# Patient Record
Sex: Female | Born: 1960
Health system: Southern US, Community
[De-identification: ages and names within clinical notes are randomized; demographics above are authoritative.]

## PROBLEM LIST (undated history)

## (undated) DIAGNOSIS — I639 Cerebral infarction, unspecified: Secondary | ICD-10-CM

## (undated) DIAGNOSIS — E871 Hypo-osmolality and hyponatremia: Secondary | ICD-10-CM

## (undated) DIAGNOSIS — E7800 Pure hypercholesterolemia, unspecified: Secondary | ICD-10-CM

## (undated) DIAGNOSIS — I10 Essential (primary) hypertension: Secondary | ICD-10-CM

## (undated) DIAGNOSIS — R569 Unspecified convulsions: Secondary | ICD-10-CM

## (undated) DIAGNOSIS — N189 Chronic kidney disease, unspecified: Secondary | ICD-10-CM

## (undated) HISTORY — PX: UTERINE FIBROID SURGERY: SHX826

## (undated) HISTORY — DX: Hypo-osmolality and hyponatremia: E87.1

## (undated) HISTORY — DX: Pure hypercholesterolemia, unspecified: E78.00

## (undated) HISTORY — DX: Chronic kidney disease, unspecified: N18.9

## (undated) HISTORY — DX: Cerebral infarction, unspecified: I63.9

## (undated) HISTORY — PX: ABDOMINAL HYSTERECTOMY: SHX81

## (undated) HISTORY — DX: Essential (primary) hypertension: I10

---

## 1996-10-21 DIAGNOSIS — I639 Cerebral infarction, unspecified: Secondary | ICD-10-CM

## 1996-10-21 HISTORY — DX: Cerebral infarction, unspecified: I63.9

## 2001-03-31 ENCOUNTER — Encounter: Payer: Self-pay | Admitting: Internal Medicine

## 2001-03-31 ENCOUNTER — Ambulatory Visit (HOSPITAL_COMMUNITY): Admission: RE | Admit: 2001-03-31 | Discharge: 2001-03-31 | Payer: Self-pay | Admitting: Internal Medicine

## 2002-02-18 ENCOUNTER — Ambulatory Visit (HOSPITAL_COMMUNITY): Admission: RE | Admit: 2002-02-18 | Discharge: 2002-02-18 | Payer: Self-pay | Admitting: Internal Medicine

## 2002-02-19 ENCOUNTER — Encounter: Payer: Self-pay | Admitting: Internal Medicine

## 2002-03-12 ENCOUNTER — Ambulatory Visit (HOSPITAL_COMMUNITY): Admission: RE | Admit: 2002-03-12 | Discharge: 2002-03-12 | Payer: Self-pay | Admitting: Cardiology

## 2002-03-19 ENCOUNTER — Ambulatory Visit (HOSPITAL_COMMUNITY): Admission: RE | Admit: 2002-03-19 | Discharge: 2002-03-19 | Payer: Self-pay | Admitting: Cardiology

## 2002-03-29 ENCOUNTER — Ambulatory Visit (HOSPITAL_COMMUNITY): Admission: RE | Admit: 2002-03-29 | Discharge: 2002-03-29 | Payer: Self-pay | Admitting: Cardiology

## 2002-03-31 ENCOUNTER — Ambulatory Visit (HOSPITAL_COMMUNITY): Admission: RE | Admit: 2002-03-31 | Discharge: 2002-03-31 | Payer: Self-pay | Admitting: Internal Medicine

## 2002-03-31 ENCOUNTER — Encounter: Payer: Self-pay | Admitting: Internal Medicine

## 2003-04-01 ENCOUNTER — Encounter: Payer: Self-pay | Admitting: Internal Medicine

## 2003-04-01 ENCOUNTER — Ambulatory Visit (HOSPITAL_COMMUNITY): Admission: RE | Admit: 2003-04-01 | Discharge: 2003-04-01 | Payer: Self-pay | Admitting: Internal Medicine

## 2004-01-30 ENCOUNTER — Ambulatory Visit (HOSPITAL_COMMUNITY): Admission: RE | Admit: 2004-01-30 | Discharge: 2004-01-30 | Payer: Self-pay | Admitting: Neurology

## 2004-10-01 ENCOUNTER — Ambulatory Visit (HOSPITAL_COMMUNITY): Admission: RE | Admit: 2004-10-01 | Discharge: 2004-10-01 | Payer: Self-pay | Admitting: Internal Medicine

## 2005-03-27 ENCOUNTER — Ambulatory Visit (HOSPITAL_COMMUNITY): Admission: RE | Admit: 2005-03-27 | Discharge: 2005-03-27 | Payer: Self-pay | Admitting: Internal Medicine

## 2005-11-13 ENCOUNTER — Ambulatory Visit (HOSPITAL_COMMUNITY): Admission: RE | Admit: 2005-11-13 | Discharge: 2005-11-13 | Payer: Self-pay | Admitting: Internal Medicine

## 2010-03-05 ENCOUNTER — Ambulatory Visit (HOSPITAL_COMMUNITY): Admission: RE | Admit: 2010-03-05 | Discharge: 2010-03-05 | Payer: Self-pay | Admitting: Internal Medicine

## 2011-03-08 NOTE — Procedures (Signed)
[facility]  Patient:    [name], [name] Visit Number: [id number] MRN: [id number]          Service Type: OUT Location: RAD Attending Physician:  Carylon Perches Dictated by:   Carylon Perches, M.D. Proc. Date: 02/18/02 Admit Date:  02/18/2002                                Stress Test  PROCEDURE:  Cardiolite stress test  CARDIOLOGIST:  Carylon Perches, M.D.  DESCRIPTION OF PROCEDURE:  The patient exercised 4 minutes and 30 seconds (1 minute and 30 seconds in stage 2 of the Bruce protocol), attaining a maximal heart rate of 135 (75% of the age-predicted maximal heart rate), at a work load of 5.7 METS, and discontinued exercise due to difficulty walking.  The treadmill was actually slowed during stage 2.  She was unable to keep up with it due to left leg weakness and stiffness from a prior stroke.  There were no symptoms of chest pain.  There were no arrhythmias.  There were no ST segment changes diagnostic of ischemia.  The baseline electrocardiogram revealed normal sinus rhythm at 67 beats per minute, with nonspecific inferolateral ST-T wave changes.  IMPRESSION:  No evidence of exercise-induced ischemia, at a suboptimal exercise level.  Cardiolite images pending. Dictated by:   Carylon Perches, M.D. Attending Physician:  Carylon Perches DD:  02/18/02 TD:  02/20/02 Job: [id number]

## 2011-03-08 NOTE — Cardiovascular Report (Signed)
[place]  Patient:    JHANVI, DRAKEFORD Visit Number: [id number] MRN: [id number]          Service Type: CAT Location: [facility] 2859 [id number] Attending Physician:  Ronaldo Miyamoto Dictated by:   Arturo Morton Riley Kill, M.D. [facility] Proc. Date: 03/19/02 Admit Date:  03/19/2002   CC:         CV Laboratory  Madolyn Frieze. Jens Som, M.D. [facility]  Carylon Perches, M.D.   Cardiac Catheterization  INDICATIONS:  Ms. Duross is a 50 year old who has had some intermittent chest pain.  She also has a history of hypertension and hyperlipidemia.  She has had a prior hysterectomy.  She currently is on multiple drug therapy.  The current study was done to assess coronary anatomy.  Fortunately, she has quit smoking in the past.  A Cardiolite study revealed decreased activity in the mid anterior wall.  She has some known renal insufficiency and was treated with Mucomyst.  A chest x-ray was done Mar 14, 2002, and revealed that the heart size and mediastinal contours were normal.  The lungs were clear and the visualized skeleton was unremarkable.  PROCEDURES: 1. Left and right catheterization. 2. Selective coronary arteriography.  CARDIOLOGIST:  Arturo Morton. Riley Kill, M.D.  DESCRIPTION OF PROCEDURE:  The procedure was performed from the right femoral artery using 6 French catheters.  She tolerated the procedure well.  There were no complications.  Importantly, we did not do ventriculogram to avoid contrast load.  HEMODYNAMIC DATA:  The central aortic pressure was 165/77, LV pressure 148/10. There was no gradient on pullback across the aortic valve.  ANGIOGRAPHIC DATA: 1. The left main coronary artery was free of critical disease.  It did taper    slightly at the ostium, but no high grade areas of narrowing were noted.  2. The left anterior descending artery coursed to the apex.  In the mid distal portion of the vessel there was perhaps minimal plaquing that did not measure more  than 20% luminal reduction.  The septal and the diagonal appeared to be free of significant disease.  3. The circumflex demonstrates minimal irregularity in the mid vessel.  There is perhaps 20% narrowing.  4. The AV circumflex is without significant disease.  5. The right coronary artery is a dominant vessel with a PDA and posterior lateral branch.  There is 20-30% narrowing that is eccentric and not high grade noted near the acute margin.  No ventriculography was done.  CONCLUSIONS:  Scattered minor irregularities without high grade focal stenosis.  DISPOSITION:  A D-diamer was obtained.  A chest x-ray has been normal.  Other sources of chest discomfort will be sought. Dictated by:   Arturo Morton Riley Kill, M.D. [facility] Attending Physician:  Ronaldo Miyamoto DD:  03/19/02 TD:  03/20/02 Job: [id number]

## 2011-04-12 ENCOUNTER — Other Ambulatory Visit (HOSPITAL_COMMUNITY): Payer: Self-pay | Admitting: Internal Medicine

## 2011-04-12 DIAGNOSIS — Z139 Encounter for screening, unspecified: Secondary | ICD-10-CM

## 2011-04-15 ENCOUNTER — Ambulatory Visit (HOSPITAL_COMMUNITY)
Admission: RE | Admit: 2011-04-15 | Discharge: 2011-04-15 | Disposition: A | Discharge status: Home / Self Care | Payer: Medicare Other | Source: Ambulatory Visit | Attending: Internal Medicine | Admitting: Internal Medicine

## 2011-04-15 DIAGNOSIS — Z1231 Encounter for screening mammogram for malignant neoplasm of breast: Secondary | ICD-10-CM | POA: Insufficient documentation

## 2011-04-15 DIAGNOSIS — Z139 Encounter for screening, unspecified: Secondary | ICD-10-CM

## 2011-08-07 LAB — COMPREHENSIVE METABOLIC PANEL
Alkaline Phosphatase: 56 U/L
BUN: 20 mg/dL (ref 4–21)
Creat: 1.27
Glucose: 94 mg/dL
Total Bilirubin: 0.4 mg/dL

## 2011-08-22 ENCOUNTER — Telehealth: Payer: Self-pay

## 2011-08-22 NOTE — Telephone Encounter (Signed)
Tried to call pt, busy.

## 2011-08-27 NOTE — Telephone Encounter (Signed)
LM for pt to call

## 2011-08-29 NOTE — Telephone Encounter (Signed)
Letter mailed for pt to call.  

## 2011-09-25 ENCOUNTER — Encounter: Payer: Self-pay | Admitting: Urgent Care

## 2011-09-25 ENCOUNTER — Ambulatory Visit (INDEPENDENT_AMBULATORY_CARE_PROVIDER_SITE_OTHER): Payer: Medicare Other | Admitting: Urgent Care

## 2011-09-25 DIAGNOSIS — Z1211 Encounter for screening for malignant neoplasm of colon: Secondary | ICD-10-CM

## 2011-09-25 DIAGNOSIS — N189 Chronic kidney disease, unspecified: Secondary | ICD-10-CM | POA: Insufficient documentation

## 2011-09-25 DIAGNOSIS — E871 Hypo-osmolality and hyponatremia: Secondary | ICD-10-CM

## 2011-09-25 NOTE — Assessment & Plan Note (Signed)
[name] is a 50 y.o. female who is due for screening colonoscopy. She denies any alarm features.  I have discussed risks & benefits which include, but are not limited to, bleeding, infection, perforation & drug reaction.  The patient agrees with this plan & written consent will be obtained.

## 2011-09-25 NOTE — Assessment & Plan Note (Signed)
She does have chronic kidney disease and recent hyponatremia. Last sodium was 126.  Due to this we will recheck met 7 prior to her prep for her colonoscopy. She will also be given tri-lytely with tap water enemas as instead of standard Fleets.

## 2011-09-25 NOTE — Assessment & Plan Note (Signed)
See CKD

## 2011-09-25 NOTE — Progress Notes (Signed)
Referring Provider: Carylon Perches, MD Primary Care Physician:  Carylon Perches, MD Primary Gastroenterologist:  Dr. Darrick Penna  Chief Complaint  Patient presents with  . Colonoscopy   HPI:  [name] is a 50 y.o. female here as a referral from Dr. Ouida Sills for screening colonoscopy.  She has hx chronic kidney disease & recent hyponatremia, therefore she was scheduled for an office visit prior to her procedure.   Last NA 126 & creatinine 1.27 on 08/07/11.  She denies any upper GI symptoms including heartburn, indigestion, nausea, vomiting, dysphagia, odynophagia or anorexia.  Denies any lower GI symptoms including constipation, diarrhea, rectal bleeding, melena or weight loss. She has never had a colonoscopy before.  Past Medical History  Diagnosis Date  . CVA (cerebral infarction) 1998  . HTN (hypertension)   . Hypercholesteremia   . Hyponatremia     Na 126  . Chronic kidney disease     Past Surgical History  Procedure Date  . Abdominal hysterectomy     complete  . Uterine fibroid surgery     x 2    Current Outpatient Prescriptions  Medication Sig Dispense Refill  . acetaminophen (TYLENOL) 650 MG CR tablet Take 650 mg by mouth every 8 (eight) hours as needed.        Marland Kitchen amLODipine (NORVASC) 10 MG tablet       . aspirin 325 MG EC tablet Take 325 mg by mouth daily.        . carvedilol (COREG) 12.5 MG tablet Take 12.5 mg by mouth 2 (two) times daily with a meal.        . cloNIDine (CATAPRES) 0.3 MG tablet       . folic acid (FOLVITE) 400 MCG tablet Take 400 mcg by mouth daily.        Marland Kitchen KLOR-CON M20 20 MEQ tablet Take 20 mEq by mouth daily.       Marland Kitchen olmesartan-hydrochlorothiazide (BENICAR HCT) 40-25 MG per tablet Take 1 tablet by mouth daily.        Marland Kitchen pyridOXINE (VITAMIN B-6) 100 MG tablet Take 100 mg by mouth daily.        . simvastatin (ZOCOR) 10 MG tablet Take 10 mg by mouth at bedtime.       . traMADol (ULTRAM) 50 MG tablet Take 50 mg by mouth every 8 (eight) hours as needed.       .  vitamin B-12 (CYANOCOBALAMIN) 100 MCG tablet Take 50 mcg by mouth daily.          Allergies as of 09/25/2011  . (No Known Allergies)    Family History:There is no known family history of colorectal carcinoma , liver disease, or inflammatory bowel disease.  Problem Relation Age of Onset  . Coronary artery disease Father   . Coronary artery disease Brother   . Coronary artery disease Brother   . Stroke Mother     History   Social History  . Marital Status: Married    Spouse Name: N/A    Number of Children: 0  . Years of Education: N/A   Occupational History  . disabled    Social History Main Topics  . Smoking status: Former Smoker -- 1.0 packs/day for 12 years    Types: Cigarettes    Quit date: 10/22/1991  . Smokeless tobacco: Not on file  . Alcohol Use: No  . Drug Use: No  . Sexually Active: Not on file  Review of Systems: Gen: Denies any fever, chills, sweats, anorexia, fatigue, weakness,  malaise, weight loss, and sleep disorder CV: Denies chest pain, angina, palpitations, syncope, orthopnea, PND, peripheral edema, and claudication. Resp: Denies dyspnea at rest, dyspnea with exercise, cough, sputum, wheezing, coughing up blood, and pleurisy. GI: Denies vomiting blood, jaundice, and fecal incontinence.   Denies dysphagia or odynophagia. GU : Denies urinary burning, blood in urine, urinary frequency, urinary hesitancy, nocturnal urination, and urinary incontinence. MS: Denies joint pain, limitation of movement, and swelling, stiffness, low back pain, extremity pain. Denies muscle weakness, cramps, atrophy.  Derm: Denies rash, itching, dry skin, hives, moles, warts, or unhealing ulcers.  Psych: Denies depression, anxiety, memory loss, suicidal ideation, hallucinations, paranoia, and confusion. Heme: Denies bruising, bleeding, and enlarged lymph nodes.  Physical Exam: BP 142/82  Pulse 53  Temp(Src) 98.2 F (36.8 C) (Temporal)  Ht 5\' 2"  (1.575 m)  Wt 158 lb 9.6 oz (71.94  kg)  BMI 29.01 kg/m2 General:   Alert,  Well-developed, well-nourished, pleasant and cooperative in NAD Head:  Normocephalic and atraumatic. Eyes:  Sclera clear, no icterus.   Conjunctiva pink. Ears:  Normal auditory acuity. Nose:  No deformity, discharge,  or lesions. Mouth:  No deformity or lesions, oropharynx pink & moist. Neck:  Supple; no masses or thyromegaly. Lungs:  Clear throughout to auscultation.   No wheezes, crackles, or rhonchi. No acute distress. Heart:  Regular rate and rhythm; 2/6 murmur.  No clicks, rubs,  or gallops. Abdomen:  Soft, nontender and nondistended. No masses, hepatosplenomegaly or hernias noted. Normal bowel sounds, without guarding, and without rebound.   Rectal:  Deferred until time of colonoscopy.   Msk:  Symmetrical without gross deformities. Normal posture. Pulses:  Normal pulses noted. Extremities:  Without clubbing or edema. Neurologic:  Alert and  oriented x4;  grossly normal neurologically. Skin:  Intact without significant lesions or rashes. Cervical Nodes:  No significant cervical adenopathy. Psych:  Alert and cooperative. Normal mood and affect.

## 2011-09-25 NOTE — Patient Instructions (Signed)
Go get your labs at least 1 week before your procedure You need a colonoscopy as planned

## 2011-09-25 NOTE — Progress Notes (Signed)
Cc to PCP 

## 2011-09-27 NOTE — Progress Notes (Signed)
PT NEED TO KNOW THE PREP CAN WORSEN HER HYPONATREMIA.  REVIEWED.

## 2011-10-08 ENCOUNTER — Encounter (HOSPITAL_COMMUNITY): Payer: Self-pay | Admitting: Pharmacy Technician

## 2011-10-09 ENCOUNTER — Telehealth: Payer: Self-pay | Admitting: Urgent Care

## 2011-10-09 NOTE — Telephone Encounter (Signed)
Pt reminded to get BMET Friday prior to TCS

## 2011-10-12 LAB — BASIC METABOLIC PANEL
BUN: 18 mg/dL (ref 6–23)
Chloride: 93 mEq/L — ABNORMAL LOW (ref 96–112)
Creat: 1.22 mg/dL — ABNORMAL HIGH (ref 0.50–1.10)
Glucose, Bld: 86 mg/dL (ref 70–99)
Potassium: 4.1 mEq/L (ref 3.5–5.3)

## 2011-10-17 NOTE — Progress Notes (Signed)
Quick Note:  Spoke to Dr. Jena Gauss. He suggested holding on TCS until sodium has been corrected. Patient needs to see PCP to further management. Save for SLF and KJ review. ______

## 2011-10-17 NOTE — Progress Notes (Signed)
Quick Note:  Pt aware that procedure has ben cancelled for [name] was informed also. ______

## 2011-10-17 NOTE — Progress Notes (Signed)
Quick Note:  Pt informed ______ 

## 2011-10-17 NOTE — Progress Notes (Signed)
Quick Note:  Sodium better but still low. Patient scheduled for colonoscopy on Monday. Will run by Dr. Jena Gauss in Dr. Darrick Penna absence as trilyte can worsen hyponatremia. ______

## 2011-10-21 ENCOUNTER — Ambulatory Visit (HOSPITAL_COMMUNITY): Admission: RE | Admit: 2011-10-21 | Payer: Medicare Other | Source: Ambulatory Visit | Admitting: Gastroenterology

## 2011-10-21 ENCOUNTER — Encounter (HOSPITAL_COMMUNITY): Admission: RE | Payer: Self-pay | Source: Ambulatory Visit

## 2011-10-21 SURGERY — COLONOSCOPY
Anesthesia: Moderate Sedation

## 2011-10-21 NOTE — Progress Notes (Signed)
REVIEWED. AGREE. 

## 2011-11-06 NOTE — Progress Notes (Signed)
Quick Note:  Let's follow up on this patient to see if PCP has addressed hyponatremia.  If so, let's go ahead and arrange for f/u to get procedure rescheduled. ______

## 2011-12-09 NOTE — Progress Notes (Signed)
Quick Note:  So, I don't see any follow up on this as requested 11/06/11. Please touch base with patient. She is still in need of TCS but need to know what PCP did for low sodium, ie has it been corrected. ______

## 2011-12-10 NOTE — Progress Notes (Signed)
Quick Note:  LMOM to call. ______ 

## 2011-12-11 NOTE — Progress Notes (Signed)
Quick Note:  LMOM to call. ______ 

## 2011-12-12 NOTE — Progress Notes (Signed)
Quick Note:  Letter mailed to pt to call. ______ 

## 2012-05-14 ENCOUNTER — Observation Stay (HOSPITAL_COMMUNITY)
Admission: EM | Admit: 2012-05-14 | Discharge: 2012-05-15 | Disposition: A | Discharge status: Home / Self Care | Payer: Medicare Other | Attending: Internal Medicine | Admitting: Internal Medicine

## 2012-05-14 ENCOUNTER — Inpatient Hospital Stay (HOSPITAL_COMMUNITY): Payer: Medicare Other

## 2012-05-14 ENCOUNTER — Inpatient Hospital Stay (HOSPITAL_COMMUNITY)
Admit: 2012-05-14 | Discharge: 2012-05-14 | Disposition: A | Discharge status: Home / Self Care | Payer: Medicare Other | Attending: Internal Medicine | Admitting: Internal Medicine

## 2012-05-14 ENCOUNTER — Encounter (HOSPITAL_COMMUNITY): Payer: Self-pay | Admitting: *Deleted

## 2012-05-14 ENCOUNTER — Emergency Department (HOSPITAL_COMMUNITY): Payer: Medicare Other

## 2012-05-14 DIAGNOSIS — Y92009 Unspecified place in unspecified non-institutional (private) residence as the place of occurrence of the external cause: Secondary | ICD-10-CM | POA: Insufficient documentation

## 2012-05-14 DIAGNOSIS — R569 Unspecified convulsions: Principal | ICD-10-CM | POA: Insufficient documentation

## 2012-05-14 DIAGNOSIS — S42253A Displaced fracture of greater tuberosity of unspecified humerus, initial encounter for closed fracture: Secondary | ICD-10-CM | POA: Insufficient documentation

## 2012-05-14 DIAGNOSIS — W19XXXA Unspecified fall, initial encounter: Secondary | ICD-10-CM | POA: Insufficient documentation

## 2012-05-14 DIAGNOSIS — I10 Essential (primary) hypertension: Secondary | ICD-10-CM | POA: Insufficient documentation

## 2012-05-14 DIAGNOSIS — R209 Unspecified disturbances of skin sensation: Secondary | ICD-10-CM | POA: Insufficient documentation

## 2012-05-14 DIAGNOSIS — N189 Chronic kidney disease, unspecified: Secondary | ICD-10-CM | POA: Insufficient documentation

## 2012-05-14 LAB — CBC WITH DIFFERENTIAL/PLATELET
Eosinophils Relative: 0 % (ref 0–5)
HCT: 42.5 % (ref 36.0–46.0)
Hemoglobin: 15.2 g/dL — ABNORMAL HIGH (ref 12.0–15.0)
Lymphocytes Relative: 8 % — ABNORMAL LOW (ref 12–46)
Lymphs Abs: 1.7 10*3/uL (ref 0.7–4.0)
MCV: 80.6 fL (ref 78.0–100.0)
Monocytes Relative: 6 % (ref 3–12)
Platelets: 335 10*3/uL (ref 150–400)
RBC: 5.27 MIL/uL — ABNORMAL HIGH (ref 3.87–5.11)
WBC: 22 10*3/uL — ABNORMAL HIGH (ref 4.0–10.5)

## 2012-05-14 LAB — URINALYSIS, ROUTINE W REFLEX MICROSCOPIC
Bilirubin Urine: NEGATIVE
Glucose, UA: NEGATIVE mg/dL
Specific Gravity, Urine: 1.02 (ref 1.005–1.030)
Urobilinogen, UA: 0.2 mg/dL (ref 0.0–1.0)
pH: 5.5 (ref 5.0–8.0)

## 2012-05-14 LAB — BASIC METABOLIC PANEL
CO2: 24 mEq/L (ref 19–32)
Calcium: 10.3 mg/dL (ref 8.4–10.5)
Glucose, Bld: 140 mg/dL — ABNORMAL HIGH (ref 70–99)
Sodium: 128 mEq/L — ABNORMAL LOW (ref 135–145)

## 2012-05-14 LAB — URINE MICROSCOPIC-ADD ON

## 2012-05-14 MED ORDER — ACETAMINOPHEN 325 MG PO TABS: 650.0000 mg | ORAL_TABLET | Freq: Four times a day (QID) | ORAL | Status: DC | PRN

## 2012-05-14 MED ORDER — HYDROCODONE-ACETAMINOPHEN 5-325 MG PO TABS: 1.0000 | ORAL_TABLET | ORAL | Status: DC | PRN

## 2012-05-14 MED ORDER — ENOXAPARIN SODIUM 30 MG/0.3ML ~~LOC~~ SOLN
30.0000 mg | SUBCUTANEOUS | Status: DC
  Administered 2012-05-14 – 2012-05-15 (×2): 30 mg via SUBCUTANEOUS
  Filled 2012-05-14 (×2): qty 0.3

## 2012-05-14 MED ORDER — IRBESARTAN 300 MG PO TABS
300.0000 mg | ORAL_TABLET | Freq: Every day | ORAL | Status: DC
  Administered 2012-05-15: 300 mg via ORAL
  Filled 2012-05-14: qty 1
  Filled 2012-05-14 (×2): qty 2

## 2012-05-14 MED ORDER — HYDROMORPHONE HCL PF 1 MG/ML IJ SOLN: 0.5000 mg | INTRAMUSCULAR | Status: DC | PRN

## 2012-05-14 MED ORDER — ONDANSETRON HCL 4 MG/2ML IJ SOLN: 4.0000 mg | Freq: Four times a day (QID) | INTRAMUSCULAR | Status: DC | PRN

## 2012-05-14 MED ORDER — SODIUM CHLORIDE 0.9 % IV SOLN
INTRAVENOUS | Status: DC
  Administered 2012-05-14: 11:00:00 via INTRAVENOUS

## 2012-05-14 MED ORDER — CLONIDINE HCL 0.2 MG PO TABS
0.3000 mg | ORAL_TABLET | Freq: Two times a day (BID) | ORAL | Status: DC
  Administered 2012-05-14 – 2012-05-15 (×2): 0.3 mg via ORAL
  Filled 2012-05-14 (×2): qty 1

## 2012-05-14 MED ORDER — ONDANSETRON HCL 4 MG PO TABS: 4.0000 mg | ORAL_TABLET | Freq: Four times a day (QID) | ORAL | Status: DC | PRN

## 2012-05-14 MED ORDER — ALUM & MAG HYDROXIDE-SIMETH 200-200-20 MG/5ML PO SUSP: 30.0000 mL | Freq: Four times a day (QID) | ORAL | Status: DC | PRN

## 2012-05-14 MED ORDER — LEVETIRACETAM 500 MG PO TABS
250.0000 mg | ORAL_TABLET | Freq: Two times a day (BID) | ORAL | Status: DC
  Administered 2012-05-14: 250 mg via ORAL
  Administered 2012-05-15: 500 mg via ORAL
  Filled 2012-05-14 (×3): qty 1

## 2012-05-14 MED ORDER — SIMVASTATIN 10 MG PO TABS
10.0000 mg | ORAL_TABLET | Freq: Every day | ORAL | Status: DC
  Administered 2012-05-14: 10 mg via ORAL
  Filled 2012-05-14: qty 1

## 2012-05-14 MED ORDER — ACETAMINOPHEN 650 MG RE SUPP: 650.0000 mg | Freq: Four times a day (QID) | RECTAL | Status: DC | PRN

## 2012-05-14 MED ORDER — AMLODIPINE BESYLATE 5 MG PO TABS
10.0000 mg | ORAL_TABLET | Freq: Every day | ORAL | Status: DC
  Administered 2012-05-15: 10 mg via ORAL
  Filled 2012-05-14: qty 2

## 2012-05-14 MED ORDER — ASPIRIN 81 MG PO CHEW
81.0000 mg | CHEWABLE_TABLET | Freq: Every day | ORAL | Status: DC
  Administered 2012-05-15: 81 mg via ORAL
  Filled 2012-05-14: qty 1

## 2012-05-14 NOTE — H&P (Signed)
NAMEDEVORAH, [name] NO.:  [id number]  MEDICAL RECORD NO.:  [id number]  LOCATION:  A216                          FACILITY:  [facility]  PHYSICIAN:  Kingsley Callander. Ouida Sills, MD       DATE OF BIRTH:  1960-12-14  DATE OF ADMISSION:  05/14/2012 DATE OF DISCHARGE:  LH                             HISTORY & PHYSICAL   CHIEF COMPLAINT:  Fall.  HISTORY OF PRESENT ILLNESS:  This patient is a 51 year old African American female who presented to the emergency room with her husband after she was found on the floor at home.  She was lethargic.  She complained of pain in her left shoulder and left arm and was not moving it well.  She was able to provide only limited history at the time of her arrival to the emergency room.  She was found to have bitten her tongue.  She did experience urinary incontinence.  She did not have any witnessed seizure activity at home, but in the emergency room she was found to have an episode of blank staring for a brief period.  She had another similar episode while in the radiology department having a CT scan of the brain.  She never had any known history of seizures.  She had experienced a right posterior parietal stroke in 1999.  She has a history of hypertension and chronic kidney disease.  PAST MEDICAL HISTORY: 1. Stroke. 2. Hypertension. 3. Chronic kidney disease. 4. Hyponatremia. 5. Hysterectomy.  MEDICATIONS: 1. Benicar HCT 40/25 mg daily. 2. Amlodipine 10 mg daily. 3. Carvedilol 12.5 mg b.i.d. 4. Clonidine 0.3 mg b.i.d. 5. Simvastatin 10 mg daily. 6. Aspirin 81 mg daily. 7. Vitamin B12 1000 mcg daily. 8. Vitamin B6 100 mg daily. 9. Folic acid 400 mcg daily. 10.Potassium 20 mEq daily.  ALLERGIES:  None.  FAMILY HISTORY:  Her mother had a stroke.  Father had an MI.  Brother had an MI.  Another brother had an MI and has diabetes.  SOCIAL HISTORY:  She is a former smoker.  She does not smoke now.  She does not drink alcohol or use  recreational substances.  REVIEW OF SYSTEMS:  No fever, vomiting, chest pain, change in bowel habits or difficulty voiding.  PHYSICAL EXAM:  GENERAL:  She appears postictal and lethargic. HEENT: Pupils are equal and reactive.  Extraocular movements are intact.  No scleral icterus.  The oropharynx reveals evidence of tongue trauma on the right. NECK:  Supple with no JVD or thyromegaly. LUNGS:  Clear. HEART:  Regular with a grade 2 systolic murmur.  She is bradycardic in the mid 40s.  Blood pressure is normal after being mildly elevated initially. ABDOMEN:  Soft and nontender with no hepatosplenomegaly. EXTREMITIES:  Tenderness and limited motion with the left shoulder. Pulses are intact.  No clubbing or edema. NEURO:  As stated, she is lethargic.  She has reluctance to move her left arm, but has normal movement and strength in the right upper extremity and right lower extremity.  She has some mild residual left lower leg weakness from a prior stroke.  The right plantar response is downgoing.  The left is equivocal. LYMPH NODES:  No cervical or supraclavicular enlargement.  LABORATORY DATA:  See attached.  White count is 22,000, creatinine is at baseline at 1.5.  Chest x-ray reveals no infiltrate.  X-rays of her left shoulder revealed a dislocation and fracture of the proximal humerus.  IMPRESSION/PLAN: 1. Probable seizure.  She has evidence of tongue biting, urinary     incontinence, and a leukocytosis.  She has had another possible     seizure while in the emergency room.  Her CT scan of the brain     reveals old strokes, but no evidence of acute infarction or     hemorrhage.  We will obtain a neurology consultation with Dr.     Gerilyn Pilgrim.  Her case has been discussed with him and EEG will be     obtained. 2. History of stroke.  No definite evidence of new stroke. 3. Left humeral fracture and shoulder dislocation.  Consult     Orthopedics.  Her case has been discussed with Dr.  Hilda Lias. 4. Hypertension.  Continue antihypertensive regimen.  Continue     simvastatin and aspirin. 5. Chronic kidney disease, stable.     Kingsley Callander. Ouida Sills, MD     ROF/MEDQ  D:  05/14/2012  T:  05/14/2012  Job:  [id number]

## 2012-05-14 NOTE — Progress Notes (Signed)
Portable EEG competed at [facility].

## 2012-05-14 NOTE — ED Notes (Signed)
Dr. Hilda Lias here to see patient.

## 2012-05-14 NOTE — ED Notes (Signed)
Pt states she woke this morning between midnight & 1am, left side tingling & numbness. Pt denies have headache.

## 2012-05-14 NOTE — ED Notes (Signed)
Pt alert and oriented x 3. Skin warm and dry. Color pink. Pt showing SB on cardiac monitor. C/o pain in her left arm with movement. Able to wiggle fingers with no problem but unable to raise arm. Family at bedside.

## 2012-05-14 NOTE — Consult Note (Signed)
Reason for Consult:fracture dislocation of the left shoulder Referring Physician: [name] is an 51 y.o. female.  HPI: Patient had what is assumed to be a seizure today.  She has been seen by Dr. Ouida Sills.  Consult also placed to neurology.  She had x-rays of chest showing fracture dislocation of the left shoulder.  Further x-rays show anterior dislocation of the left shoulder with displaced fracture most likely the greater tuberosity.    She is post ictal.  I have discussed with her husband the findings and seriousness of the dislocation/fracture.  I will attempt to reduce in the ER now.  It may need surgery later to fix the fracture and also I might not be able to reduce the dislocation.  He appears to understand.  He agrees to it.  Past Medical History  Diagnosis Date  . CVA (cerebral infarction) 1998  . HTN (hypertension)   . Hypercholesteremia   . Hyponatremia     Na 126  . Chronic kidney disease     Past Surgical History  Procedure Date  . Abdominal hysterectomy     complete  . Uterine fibroid surgery     x 2    Family History  Problem Relation Age of Onset  . Coronary artery disease Father   . Coronary artery disease Brother   . Coronary artery disease Brother   . Stroke Mother     Social History:  reports that she quit smoking about 20 years ago. Her smoking use included Cigarettes. She has a 12 pack-year smoking history. She does not have any smokeless tobacco history on file. She reports that she does not drink alcohol or use illicit drugs.  Allergies: No Known Allergies  Medications: I have reviewed the patient's current medications.  Results for orders placed during the hospital encounter of 05/14/12 (from the past 48 hour(s))  CBC WITH DIFFERENTIAL     Status: Abnormal   Collection Time   05/14/12  6:27 AM      Component Value Range Comment   WBC 22.0 (*) 4.0 - 10.5 K/uL    RBC 5.27 (*) 3.87 - 5.11 MIL/uL    Hemoglobin 15.2 (*) 12.0 - 15.0 g/dL      HCT 16.1  09.6 - 04.5 %    MCV 80.6  78.0 - 100.0 fL    MCH 28.8  26.0 - 34.0 pg    MCHC 35.8  30.0 - 36.0 g/dL    RDW 40.9  81.1 - 91.4 %    Platelets 335  150 - 400 K/uL    Neutrophils Relative 86 (*) 43 - 77 %    Neutro Abs 19.0 (*) 1.7 - 7.7 K/uL    Lymphocytes Relative 8 (*) 12 - 46 %    Lymphs Abs 1.7  0.7 - 4.0 K/uL    Monocytes Relative 6  3 - 12 %    Monocytes Absolute 1.2 (*) 0.1 - 1.0 K/uL    Eosinophils Relative 0  0 - 5 %    Eosinophils Absolute 0.0  0.0 - 0.7 K/uL    Basophils Relative 0  0 - 1 %    Basophils Absolute 0.0  0.0 - 0.1 K/uL   BASIC METABOLIC PANEL     Status: Abnormal   Collection Time   05/14/12  6:27 AM      Component Value Range Comment   Sodium 128 (*) 135 - 145 mEq/L    Potassium 3.6  3.5 - 5.1 mEq/L  Chloride 90 (*) 96 - 112 mEq/L    CO2 24  19 - 32 mEq/L    Glucose, Bld 140 (*) 70 - 99 mg/dL    BUN 22  6 - 23 mg/dL    Creatinine, Ser 0.86 (*) 0.50 - 1.10 mg/dL    Calcium 57.8  8.4 - 10.5 mg/dL    GFR calc non Af Amer 37 (*) >90 mL/min    GFR calc Af Amer 43 (*) >90 mL/min   TROPONIN I     Status: Normal   Collection Time   05/14/12  6:27 AM      Component Value Range Comment   Troponin I <0.30  <0.30 ng/mL     Dg Chest 1 View  05/14/2012  *RADIOLOGY REPORT*  Clinical Data: Left shoulder numbness and pain  CHEST - 1 VIEW  Comparison: None.  Findings: Mild cardiomegaly.  Clear lungs.  No pneumothorax.  No pleural effusion.  No obvious acute rib fracture.  Fracture dislocation of the proximal left humerus is noted.  IMPRESSION: Cardiomegaly without edema.  Proximal left humerus and shoulder injury.  Original Report Authenticated By: Donavan Burnet, M.D.   Ct Head Wo Contrast  05/14/2012  *RADIOLOGY REPORT*  Clinical Data: Left-sided weakness  CT HEAD WITHOUT CONTRAST  Technique:  Contiguous axial images were obtained from the base of the skull through the vertex without contrast.  Comparison: MRI brain dated 01/30/2004  Findings: No evidence  of parenchymal hemorrhage or extra-axial fluid collection. No mass lesion, mass effect, or midline shift.  No CT evidence of acute infarction.  Old infarcts in the right cerebellum and left occipital lobe.  Intracranial atherosclerosis.  Cerebral volume is age appropriate.  No ventriculomegaly.  The visualized paranasal sinuses are essentially clear. The mastoid air cells are unopacified.  No evidence of calvarial fracture.  IMPRESSION: No evidence of acute intracranial abnormality.  Old infarcts in the right cerebellum and left occipital lobe.  Original Report Authenticated By: Charline Bills, M.D.   Dg Shoulder Left  05/14/2012  *RADIOLOGY REPORT*  Clinical Data: Left shoulder pain  LEFT SHOULDER - 2+ VIEW  Comparison: None.  Findings: There is a displaced fracture involving the greater tuberosity of the proximal humerus.  There is complete anterior dislocation of the articular surface of the humeral head with respect to the glenoid. Clavicle and scapula are intact.  IMPRESSION: Anterior glenohumeral dislocation associated with a displaced greater tuberosity fracture.  Original Report Authenticated By: Donavan Burnet, M.D.   Dg Humerus Left  05/14/2012  *RADIOLOGY REPORT*  Clinical Data: Left shoulder pain  LEFT HUMERUS - 2+ VIEW  Comparison: None.  Findings: Anterior left glenohumeral dislocation is associated with a displaced greater tuberosity fracture.  The mid and distal humerus are intact.  IMPRESSION: Mid and distal humerus are intact.  Anterior glenohumeral dislocation is associated with a displaced fracture of the greater tuberosity.  Original Report Authenticated By: Donavan Burnet, M.D.    Review of Systems  Constitutional: Negative.   HENT: Negative.   Eyes: Negative.   Respiratory: Negative.   Cardiovascular: Negative.   Gastrointestinal: Negative.   Genitourinary:       History of kidney disease  Musculoskeletal: Positive for joint pain (Left shoulder pain and inability to use  shoulder ).  Skin: Negative.   Neurological: Positive for seizures (today.  First time.  ).  Endo/Heme/Allergies: Negative.   Psychiatric/Behavioral: Negative.    Blood pressure 145/71, pulse 50, resp. rate 16, height 5\' 2"  (1.575  m), weight 72.576 kg (160 lb), SpO2 100.00%. Physical Exam  Constitutional: She appears well-developed and well-nourished.  HENT:  Head: Normocephalic.  Eyes: Conjunctivae and EOM are normal. Pupils are equal, round, and reactive to light.  Neck: Normal range of motion. Neck supple.  Cardiovascular: Normal rate, regular rhythm, normal heart sounds and intact distal pulses.   Respiratory: Effort normal and breath sounds normal.  GI: Soft. Bowel sounds are normal.  Musculoskeletal: She exhibits tenderness (Pain, decreased motion of the left shoulder.  NV intact.).       Left shoulder: She exhibits decreased range of motion, tenderness and crepitus.       Arms: Neurological:       Lethargic, post ictal   Skin: Skin is warm and dry.  Psychiatric:       Post ictal.  Lethargic.    Assessment/Plan: Fracture/dislocation of the left shoulder, anterior dislocation with fragment appearing to come off the greater tuberosity.  Seizure earlier today.  I have attempted closed reduction of the left shoulder in the ER.  X-rays are pending. First set showed incomplete reduction.  I did it again with reduction.  The fracture fragment does appear to be from the greater tuberosity.  I did the reduction with no medication as I felt with the seizure medication could impair evaluation of mental function.  She was in post ictal state and relaxed and lethargic.  I explained this to the husband.  He understood. She tolerated it well.  Shoulder immobilizer needs to be worn on the left upper extremity.  Head of bed needs to be raised 30 degrees all times.  Ice pack to shoulder.  [name] 05/14/2012, 8:44 AM

## 2012-05-14 NOTE — Consult Note (Signed)
Reason for Consult: Referring Physician:   [name] is an 51 y.o. female.  HPI:   Past Medical History  Diagnosis Date  . CVA (cerebral infarction) 1998  . HTN (hypertension)   . Hypercholesteremia   . Hyponatremia     Na 126  . Chronic kidney disease     Past Surgical History  Procedure Date  . Abdominal hysterectomy     complete  . Uterine fibroid surgery     x 2    Family History  Problem Relation Age of Onset  . Coronary artery disease Father   . Coronary artery disease Brother   . Coronary artery disease Brother   . Stroke Mother     Social History:  reports that she quit smoking about 20 years ago. Her smoking use included Cigarettes. She has a 12 pack-year smoking history. She does not have any smokeless tobacco history on file. She reports that she does not drink alcohol or use illicit drugs.  Allergies: No Known Allergies  Medications: Prior to Admission medications   Medication Sig Start Date End Date Taking? Authorizing Provider  amLODipine (NORVASC) 10 MG tablet Take 10 mg by mouth daily.  09/13/11  Yes Historical Provider, MD  aspirin 325 MG EC tablet Take 325 mg by mouth daily.     Yes Historical Provider, MD  folic acid (FOLVITE) 400 MCG tablet Take 400 mcg by mouth daily.     Yes Historical Provider, MD  KLOR-CON M20 20 MEQ tablet Take 20 mEq by mouth daily.  09/02/11  Yes Historical Provider, MD  olmesartan-hydrochlorothiazide (BENICAR HCT) 40-25 MG per tablet Take 1 tablet by mouth daily.   Yes Historical Provider, MD  pyridOXINE (VITAMIN B-6) 100 MG tablet Take 100 mg by mouth daily.     Yes Historical Provider, MD  simvastatin (ZOCOR) 10 MG tablet Take 10 mg by mouth at bedtime.  09/13/11  Yes Historical Provider, MD  vitamin B-12 (CYANOCOBALAMIN) 100 MCG tablet Take 100 mcg by mouth daily.    Yes Historical Provider, MD  acetaminophen (TYLENOL) 650 MG CR tablet Take 650 mg by mouth every 8 (eight) hours as needed. Pain    Historical Provider,  MD  carvedilol (COREG) 12.5 MG tablet Take 12.5 mg by mouth 2 (two) times daily with a meal.      Historical Provider, MD  cloNIDine (CATAPRES) 0.3 MG tablet Take 0.3 mg by mouth daily.  09/02/11   Historical Provider, MD  traMADol (ULTRAM) 50 MG tablet Take 50 mg by mouth every 8 (eight) hours as needed. Pain 08/02/11   Historical Provider, MD     Results for orders placed during the hospital encounter of 05/14/12 (from the past 48 hour(s))  CBC WITH DIFFERENTIAL     Status: Abnormal   Collection Time   05/14/12  6:27 AM      Component Value Range Comment   WBC 22.0 (*) 4.0 - 10.5 K/uL    RBC 5.27 (*) 3.87 - 5.11 MIL/uL    Hemoglobin 15.2 (*) 12.0 - 15.0 g/dL    HCT 16.1  09.6 - 04.5 %    MCV 80.6  78.0 - 100.0 fL    MCH 28.8  26.0 - 34.0 pg    MCHC 35.8  30.0 - 36.0 g/dL    RDW 40.9  81.1 - 91.4 %    Platelets 335  150 - 400 K/uL    Neutrophils Relative 86 (*) 43 - 77 %    Neutro Abs  19.0 (*) 1.7 - 7.7 K/uL    Lymphocytes Relative 8 (*) 12 - 46 %    Lymphs Abs 1.7  0.7 - 4.0 K/uL    Monocytes Relative 6  3 - 12 %    Monocytes Absolute 1.2 (*) 0.1 - 1.0 K/uL    Eosinophils Relative 0  0 - 5 %    Eosinophils Absolute 0.0  0.0 - 0.7 K/uL    Basophils Relative 0  0 - 1 %    Basophils Absolute 0.0  0.0 - 0.1 K/uL   BASIC METABOLIC PANEL     Status: Abnormal   Collection Time   05/14/12  6:27 AM      Component Value Range Comment   Sodium 128 (*) 135 - 145 mEq/L    Potassium 3.6  3.5 - 5.1 mEq/L    Chloride 90 (*) 96 - 112 mEq/L    CO2 24  19 - 32 mEq/L    Glucose, Bld 140 (*) 70 - 99 mg/dL    BUN 22  6 - 23 mg/dL    Creatinine, Ser 1.61 (*) 0.50 - 1.10 mg/dL    Calcium 09.6  8.4 - 10.5 mg/dL    GFR calc non Af Amer 37 (*) >90 mL/min    GFR calc Af Amer 43 (*) >90 mL/min   TROPONIN I     Status: Normal   Collection Time   05/14/12  6:27 AM      Component Value Range Comment   Troponin I <0.30  <0.30 ng/mL   MRSA PCR SCREENING     Status: Normal   Collection Time   05/14/12  10:42 AM      Component Value Range Comment   MRSA by PCR NEGATIVE  NEGATIVE   URINALYSIS, ROUTINE W REFLEX MICROSCOPIC     Status: Abnormal   Collection Time   05/14/12  2:54 PM      Component Value Range Comment   Color, Urine YELLOW  YELLOW    APPearance CLEAR  CLEAR    Specific Gravity, Urine 1.020  1.005 - 1.030    pH 5.5  5.0 - 8.0    Glucose, UA NEGATIVE  NEGATIVE mg/dL    Hgb urine dipstick MODERATE (*) NEGATIVE    Bilirubin Urine NEGATIVE  NEGATIVE    Ketones, ur NEGATIVE  NEGATIVE mg/dL    Protein, ur TRACE (*) NEGATIVE mg/dL    Urobilinogen, UA 0.2  0.0 - 1.0 mg/dL    Nitrite POSITIVE (*) NEGATIVE    Leukocytes, UA NEGATIVE  NEGATIVE   URINE MICROSCOPIC-ADD ON     Status: Abnormal   Collection Time   05/14/12  2:54 PM      Component Value Range Comment   Squamous Epithelial / LPF FEW (*) RARE    WBC, UA 3-6  <3 WBC/hpf    RBC / HPF 3-6  <3 RBC/hpf    Bacteria, UA FEW (*) RARE     Dg Chest 1 View  05/14/2012  *RADIOLOGY REPORT*  Clinical Data: Left shoulder numbness and pain  CHEST - 1 VIEW  Comparison: None.  Findings: Mild cardiomegaly.  Clear lungs.  No pneumothorax.  No pleural effusion.  No obvious acute rib fracture.  Fracture dislocation of the proximal left humerus is noted.  IMPRESSION: Cardiomegaly without edema.  Proximal left humerus and shoulder injury.  Original Report Authenticated By: Donavan Burnet, M.D.   Ct Head Wo Contrast  05/14/2012  *RADIOLOGY REPORT*  Clinical Data: Left-sided  weakness  CT HEAD WITHOUT CONTRAST  Technique:  Contiguous axial images were obtained from the base of the skull through the vertex without contrast.  Comparison: MRI brain dated 01/30/2004  Findings: No evidence of parenchymal hemorrhage or extra-axial fluid collection. No mass lesion, mass effect, or midline shift.  No CT evidence of acute infarction.  Old infarcts in the right cerebellum and left occipital lobe.  Intracranial atherosclerosis.  Cerebral volume is age  appropriate.  No ventriculomegaly.  The visualized paranasal sinuses are essentially clear. The mastoid air cells are unopacified.  No evidence of calvarial fracture.  IMPRESSION: No evidence of acute intracranial abnormality.  Old infarcts in the right cerebellum and left occipital lobe.  Original Report Authenticated By: Charline Bills, M.D.   Dg Shoulder Left  05/14/2012  *RADIOLOGY REPORT*  Clinical Data: Left shoulder pain  LEFT SHOULDER - 2+ VIEW  Comparison: None.  Findings: There is a displaced fracture involving the greater tuberosity of the proximal humerus.  There is complete anterior dislocation of the articular surface of the humeral head with respect to the glenoid. Clavicle and scapula are intact.  IMPRESSION: Anterior glenohumeral dislocation associated with a displaced greater tuberosity fracture.  Original Report Authenticated By: Donavan Burnet, M.D.   Dg Shoulder Left Port  05/14/2012  *RADIOLOGY REPORT*  Clinical Data: History of anterior glenohumeral dislocation with displaced fracture of the greater tuberosity of the humerus.  Post reduction.  PORTABLE LEFT SHOULDER - 2+ VIEW  Comparison: Previous study of same date.  Findings: The previous anterior glenohumeral dislocation has been reduced. There is again evidence of fracture of the greater tuberosity of the humerus with lateral and proximal displacement of the fracture fragment.  IMPRESSION: Interval reduction of the anterior glenohumeral dislocation. Fracture of the greater tuberosity of the humerus.  Original Report Authenticated By: Crawford Givens, M.D.   Dg Humerus Left  05/14/2012  *RADIOLOGY REPORT*  Clinical Data: Left shoulder pain  LEFT HUMERUS - 2+ VIEW  Comparison: None.  Findings: Anterior left glenohumeral dislocation is associated with a displaced greater tuberosity fracture.  The mid and distal humerus are intact.  IMPRESSION: Mid and distal humerus are intact.  Anterior glenohumeral dislocation is associated with a  displaced fracture of the greater tuberosity.  Original Report Authenticated By: Donavan Burnet, M.D.    Review of Systems  Constitutional: Negative.   Eyes: Negative.   Respiratory: Negative.   Cardiovascular: Negative.   Gastrointestinal: Negative.   Genitourinary: Negative.   Musculoskeletal: Negative.   Skin: Negative.    Blood pressure 177/89, pulse 55, temperature 99.2 F (37.3 C), temperature source Oral, resp. rate 18, height 5\' 7"  (1.702 m), weight 70 kg (154 lb 5.2 oz), SpO2 100.00%. Physical Exam  Assessment/Plan See dict  [name] 05/14/2012, 6:57 PM

## 2012-05-14 NOTE — ED Notes (Signed)
Dr. Hilda Lias attempted to put shoulder back in place. Portable x-ray to be done to check placement. Pt remains drowsy, but arouses to verbal stimuli.

## 2012-05-14 NOTE — ED Notes (Signed)
Pt back from x-ray. Dr. Ouida Sills in with patient. Pt drowsy but arouses to stimuli and follows commands. Orthopedic consult done by Dr. Ouida Sills.

## 2012-05-15 LAB — CBC
MCH: 28.1 pg (ref 26.0–34.0)
Platelets: 282 10*3/uL (ref 150–400)
RBC: 4.67 MIL/uL (ref 3.87–5.11)

## 2012-05-15 MED ORDER — LEVETIRACETAM 250 MG PO TABS: 250.0000 mg | ORAL_TABLET | Freq: Two times a day (BID) | ORAL | Status: AC

## 2012-05-15 NOTE — Progress Notes (Signed)
UR Chart Review Completed  

## 2012-05-15 NOTE — Consult Note (Signed)
[name], [name]               ACCOUNT NO.:  [id number]  MEDICAL RECORD NO.:  [id number]  LOCATION:  A216                          FACILITY:  [facility]  PHYSICIAN:  Charliee Krenz A. Gerilyn Pilgrim, M.D. DATE OF BIRTH:  07-28-1961  DATE OF CONSULTATION: DATE OF DISCHARGE:                                CONSULTATION   HISTORY OF PRESENT ILLNESS:  This is a 51 year old black female, who was found by her husband on the floor.  She was unresponsive, confused, and lethargic.  She was incontinence of urine and was bleeding from her mouth, but she clearly has been her tongue on the right side.  The patient was taken to the hospital where she was noted to be weak on the left side.  She does have a previous history of stroke per Dr. Ouida Sills on the left side several years ago, was 51 years old.  Was worked up at [facility] for hypercoagulable state but this apparently was rather negative for hypercoagulable state.  The patient was noted to have significant pain of the left shoulder and was with the diagnosis having a dislocated shoulder.  This has been reduced by Dr. Hilda Lias.  She still has some pain there and is carrying a sling.  It appears that she does have mild residual weakness on the left side status post her stroke several years ago.  She is on aspirin therapy.  Risk factor apparently is poorly controllable hypertension.  The patient has never had a seizure.  It was presumed that she had a seizure as the reason why she was found unresponsive and incontinence of urine.  Labs are being significant for sodium of 128 to 126, although I do not think low enough to cause her seizures.  WBC 22, likely from her event. No other significant metabolic derangements.  PHYSICAL EXAMINATION:  GENERAL:  Shows a pleasant overweight lady, in no acute distress. HEENT EVALUATION:  She does have extensive conjunctival hemorrhage of left eye.  There is a healing tongue bite on the right side. NECK:  Supple. ABDOMEN:   Soft. EXTREMITIES:  Left upper extremity is in a sling.  She still have pain with movement of the shoulder. NEUROLOGIC:  Cranial nerve evaluation showed the pupils are 4 mm and round.  They are briskly reactive.  Extraocular movements are full. Visual fields are intact.  Facial muscle strength is symmetric.  Tongue is midline.  Uvula midline.  Shoulder shrug is reduced on the left side. Motor examination, left upper extremity again is weak, particularly or possibly from her dislocation.  She does have antigravity strengths however.  Left lower extremity shows normal tone, bulk, and strength. Right side also show normal tone, bulk, and strength.  Reflexes tested in the legs in the right upper extremity.  They are 2+.  Plantars are equivocal on the left and flexor on the right.  Coordination shows no tremors or dysmetria.  Sensation is normal to pain bilaterally.  CT scan reviewed in person and shows a small lucency right cerebellum. There is also bilateral occipital encephalomalacia or small concerning for remote infarcts nothing acute is seen.  I did also review the patient's brain MRI done in 2003, that MRI  shows evidence of multiple small subcortical hyperintensities.  There is some evidence again of bilateral small occipital encephalomalacias suggestive of a posterior circulation infarcts.  There is also a cortically based hyperintense lesion involving the central sulcus or at least closest on the right side.  The findings seems to suggest cortical based infarcts, possibly cardioembolic.  IMPRESSION:  Likely new onset seizures.  I suspect that the patient's cortical based infarcts, SIRS of the substrate for the seizure.  Given the cortical based infarcts, she likely has increased risk of recurrent events, therefore, I will recommend to put her on long term seizure medication.  EEG will be obtained to help guide this also.  I would suggest Keppra 250 b.i.d.  We may consider increase  it to 500 b.i.d. in a month or so.  I did discuss seizure precautions, particularly no driving for the next 6 months.  I am also going to recommend discontinuation of Ultram as this can be associated with seizures.     Nameer Summer A. Gerilyn Pilgrim, M.D.     KAD/MEDQ  D:  05/14/2012  T:  05/15/2012  Job:  [id number]

## 2012-05-15 NOTE — Progress Notes (Signed)
Subjective: My shoulder is sore   Objective: Vital signs in last 24 hours: Temp:  [98.1 F (36.7 C)-99.3 F (37.4 C)] 99.3 F (37.4 C) (07/26 0400) Pulse Rate:  [50-70] 57  (07/26 0400) Resp:  [16-20] 16  (07/26 0400) BP: (114-185)/(71-95) 139/90 mmHg (07/26 0400) SpO2:  [99 %-100 %] 100 % (07/26 0400) Weight:  [70 kg (154 lb 5.2 oz)-70.2 kg (154 lb 12.2 oz)] 70.2 kg (154 lb 12.2 oz) (07/26 0500)  Intake/Output from previous day: 07/25 0701 - 07/26 0700 In: 866.7 [P.O.:400; I.V.:466.7] Out: -  Intake/Output this shift:     Basename 05/15/12 0449 05/14/12 0627  HGB 13.1 15.2*    Basename 05/15/12 0449 05/14/12 0627  WBC 12.9* 22.0*  RBC 4.67 5.27*  HCT 37.6 42.5  PLT 282 335    Basename 05/14/12 0627  NA 128*  K 3.6  CL 90*  CO2 24  BUN 22  CREATININE 1.57*  GLUCOSE 140*  CALCIUM 10.3   No results found for this basename: LABPT:2,INR:2 in the last 72 hours  Neurologically intact Neurovascular intact Sensation intact distally Intact pulses distally  Her shoulder immobilizer on the left is in place.    She has no memory of my relocating her shoulder.  I have told her and her husband this morning that the shoulder may need surgery.  I want to see how she does over the next several days.  Assessment/Plan: Post fracture/dislocation of the left shoulder.  Stable for now.   Docie Abramovich 05/15/2012, 7:01 AM

## 2012-05-15 NOTE — Procedures (Signed)
NAMECORINDA, AMMON               ACCOUNT NO.:  [id number]  MEDICAL RECORD NO.:  [id number]  LOCATION:  A216                          FACILITY:  [facility]  PHYSICIAN:  Tyerra Loretto A. Gerilyn Pilgrim, M.D. DATE OF BIRTH:  [birth year]  DATE OF PROCEDURE:  05/14/2012 DATE OF DISCHARGE:  05/15/2012                             EEG INTERPRETATION   INDICATIONS:  A 51 year old female who had new onset seizure activity.  MEDICATIONS:  Aspirin, Lovenox, Norvasc, Avapro, Zocor, clonidine.  ANALYSIS:  A 16-channel recording using standard 10/20 measurements conducted for 21 minute.  There is a well-formed posterior dominant rhythm of 11 Hz, which attenuates with eye opening.  There is beta activity observed in the frontal areas.  Awake and sleep activities are recorded.  K complexes and sleep spindles are observed.  Photic stimulation is carried out without abnormal changes in the background activity.  The patient is noted to have focal slowing involving the right parietal, temporal, occipital region particularly P4 to O2, T6 and O2.  There is some involvement at T4 and T6 also.  There is no lateralized slowing.  There is no epileptiform activity observed.  IMPRESSION:  Abnormal recording showing focal right parieto-occipital slowing.  Slowing can be associated with focal cerebral disturbance and/or focal epileptic focus.     Ikeisha Blumberg A. Gerilyn Pilgrim, M.D.     KAD/MEDQ  D:  05/15/2012  T:  05/15/2012  Job:  [id number]

## 2012-05-15 NOTE — Discharge Summary (Signed)
NAMETREVOR, DUTY NO.:  [id number]  MEDICAL RECORD NO.:  [id number]  LOCATION:  A216                          FACILITY:  [facility]  PHYSICIAN:  Kingsley Callander. Ouida Sills, MD       DATE OF BIRTH:  December 18, 1960  DATE OF ADMISSION:  05/14/2012 DATE OF DISCHARGE:  07/26/2013LH                              DISCHARGE SUMMARY   DISCHARGE DIAGNOSES: 1. Seizure. 2. Hypertension. 3. Chronic kidney disease. 4. History of stroke.  DISCHARGE MEDICATIONS: 1. Keppra 250 mg twice a day. 2. Benicar HCT 40/25 daily. 3. Amlodipine 10 mg daily. 4. Carvedilol 12.5 mg b.i.d. 5. Clonidine 0.3 mg b.i.d. 6. Simvastatin 10 mg at bedtime. 7. Aspirin 81 mg daily. 8. Vitamin B12 1000 mcg daily. 9. Vitamin B6 100 mg daily. 10.Folic acid 400 mcg daily. 11.Potassium 20 mEq daily.  HOSPITAL COURSE:  This patient is a 51 year old female who presented to the emergency room after she fell at home and was extremely lethargic. She had pain in her left shoulder and arm.  She had bitten her tongue and had involuntarily urinated on herself when found by her husband.  He brought her to the emergency room where she appeared to be in a postictal state.  Her left shoulder and arm pain were evaluated with x- rays, which revealed a fracture and dislocation of her left humerus. She was seen in orthopedic consultation by Dr. Hilda Lias.  She underwent a reduction of her dislocation.  She is placed in a sling.  Her postictal state finally improved.  She became alert and returned to her usual neurological status.  She was seen in neurology consultation by Dr. Gerilyn Pilgrim.  The CT scan of her brain revealed old strokes, but no acute abnormalities.  She has undergone an EEG, which remained pending. She was felt again to have likely have had a seizure.  She was started on Keppra 250 mg twice a day.  She had no further seizures.  She was much improved and stable for discharge on the evening of the 26th.  She will be seen in  followup in Dr. Sanjuan Dame office on next Tuesday or Wednesday.  She will be seen in followup in my office in a week.  She will not drive for 6 months.  She will continue wearing her left shoulder immobilizer.  DISCHARGE DIAGNOSES:  Left shoulder dislocation and humeral fracture.  Her chronic kidney disease has remained stable.  Creatinine is 1.57. She has had a chronic hyponatremia, which is stable at 128.  She had an initial leukocytosis felt related to her seizure of 22,000. This improved to 12.9 on May 15, 2012.     Kingsley Callander. Ouida Sills, MD     ROF/MEDQ  D:  05/15/2012  T:  05/15/2012  Job:  [id number]

## 2012-05-18 NOTE — Care Management Note (Unsigned)
    Page 1 of 1   05/18/2012     9:10:28 AM   CARE MANAGEMENT NOTE 05/18/2012  Patient:  [name], [name]   Account Number:  [id number]  Date Initiated:  05/15/2012  Documentation initiated by:  Sharrie Rothman  Subjective/Objective Assessment:   Pt admitted from home with fall, possible seizure, and dislocated left shoulder. Pt lives with husband and will return home at discharge. Pt was independent with ADL's prior to discharge.     Action/Plan:   CM to follow for any HH needs.   Anticipated DC Date:  05/18/2012   Anticipated DC Plan:  HOME/SELF CARE      DC Planning Services  CM consult      Choice offered to / List presented to:             Status of service:  In process, will continue to follow Medicare Important Message given?   (If response is "NO", the following Medicare IM given date fields will be blank) Date Medicare IM given:   Date Additional Medicare IM given:    Discharge Disposition:    Per UR Regulation:    If discussed at Long Length of Stay Meetings, dates discussed:    Comments:  05/15/12 1545 Arlyss Queen, RN BSN CM

## 2013-08-17 ENCOUNTER — Other Ambulatory Visit (HOSPITAL_COMMUNITY): Payer: Self-pay | Admitting: Internal Medicine

## 2013-08-17 DIAGNOSIS — Z139 Encounter for screening, unspecified: Secondary | ICD-10-CM

## 2013-08-26 ENCOUNTER — Ambulatory Visit (HOSPITAL_COMMUNITY)
Admission: RE | Admit: 2013-08-26 | Discharge: 2013-08-26 | Disposition: A | Discharge status: Home / Self Care | Payer: Medicare Other | Source: Ambulatory Visit | Attending: Internal Medicine | Admitting: Internal Medicine

## 2013-08-26 DIAGNOSIS — Z1231 Encounter for screening mammogram for malignant neoplasm of breast: Secondary | ICD-10-CM | POA: Insufficient documentation

## 2013-08-26 DIAGNOSIS — Z139 Encounter for screening, unspecified: Secondary | ICD-10-CM

## 2014-07-20 ENCOUNTER — Other Ambulatory Visit (HOSPITAL_COMMUNITY): Payer: Self-pay | Admitting: Internal Medicine

## 2014-07-20 DIAGNOSIS — Z139 Encounter for screening, unspecified: Secondary | ICD-10-CM

## 2014-09-02 ENCOUNTER — Ambulatory Visit (HOSPITAL_COMMUNITY)
Admission: RE | Admit: 2014-09-02 | Discharge: 2014-09-02 | Disposition: A | Discharge status: Home / Self Care | Payer: Medicare Other | Source: Ambulatory Visit | Attending: Internal Medicine | Admitting: Internal Medicine

## 2014-09-02 DIAGNOSIS — Z1231 Encounter for screening mammogram for malignant neoplasm of breast: Secondary | ICD-10-CM | POA: Insufficient documentation

## 2014-09-02 DIAGNOSIS — Z139 Encounter for screening, unspecified: Secondary | ICD-10-CM

## 2014-11-26 DIAGNOSIS — Z79899 Other long term (current) drug therapy: Secondary | ICD-10-CM | POA: Diagnosis not present

## 2014-12-02 DIAGNOSIS — I10 Essential (primary) hypertension: Secondary | ICD-10-CM | POA: Diagnosis not present

## 2014-12-02 DIAGNOSIS — N189 Chronic kidney disease, unspecified: Secondary | ICD-10-CM | POA: Diagnosis not present

## 2014-12-02 DIAGNOSIS — E871 Hypo-osmolality and hyponatremia: Secondary | ICD-10-CM | POA: Diagnosis not present

## 2014-12-02 DIAGNOSIS — R569 Unspecified convulsions: Secondary | ICD-10-CM | POA: Diagnosis not present

## 2014-12-12 ENCOUNTER — Telehealth: Payer: Self-pay

## 2014-12-12 NOTE — Telephone Encounter (Signed)
Pt received letter from DS to set up tcs. Please call her back at [phone]

## 2014-12-13 NOTE — Telephone Encounter (Signed)
Pt has hx of seizures but said her last one was sometime in 2014. She will bring her list of meds by here on Thurs to get triaged.

## 2014-12-15 ENCOUNTER — Telehealth: Payer: Self-pay

## 2014-12-20 NOTE — Telephone Encounter (Signed)
Gastroenterology Pre-Procedure Review  Request Date: 12/15/2014 Requesting Physician: Dr. Ouida SillsFagan  PATIENT REVIEW QUESTIONS: The patient responded to the following health history questions as indicated:    This will be pt's first colonoscopy. She was scheduled in 09/2011 and had to cancel due to some labs ( she said her sodium)  She has a hx of seizures but said her last one was in 2014. She has done well on the Keppra  1. Diabetes Melitis: no 2. Joint replacements in the past 12 months: no 3. Major health problems in the past 3 months: no 4. Has an artificial valve or MVP: no 5. Has a defibrillator: no 6. Has been advised in past to take antibiotics in advance of a procedure like teeth cleaning: no    MEDICATIONS & ALLERGIES:    Patient reports the following regarding taking any blood thinners:   Plavix? no Aspirin? YES Coumadin? no  Patient confirms/reports the following medications:  Current Outpatient Prescriptions  Medication Sig Dispense Refill  . acetaminophen (TYLENOL) 500 MG tablet Take 500 mg by mouth every 6 (six) hours as needed.    Marland Kitchen. amLODipine (NORVASC) 10 MG tablet Take 10 mg by mouth daily.     Marland Kitchen. aspirin EC 81 MG tablet Take 325 mg by mouth daily.     . carvedilol (COREG) 12.5 MG tablet Take 25 mg by mouth 2 (two) times daily with a meal.     . clonazePAM (KLONOPIN) 0.5 MG tablet Take 0.5 mg by mouth 2 (two) times daily as needed for anxiety.    . cloNIDine (CATAPRES) 0.3 MG tablet Take 0.3 mg by mouth daily.     Marland Kitchen. KLOR-CON M20 20 MEQ tablet Take 20 mEq by mouth daily.     Marland Kitchen. levETIRAcetam (KEPPRA) 500 MG tablet Take 500 mg by mouth 2 (two) times daily.    . NON FORMULARY Vitamin D3  1000 IU   One tablet daily    . olmesartan-hydrochlorothiazide (BENICAR HCT) 40-25 MG per tablet Take 1 tablet by mouth daily.    . simvastatin (ZOCOR) 10 MG tablet Take 10 mg by mouth at bedtime.     . folic acid (FOLVITE) 400 MCG tablet Take 400 mcg by mouth daily.      Marland Kitchen.  levETIRAcetam (KEPPRA) 250 MG tablet Take 1 tablet (250 mg total) by mouth 2 (two) times daily. (Patient taking differently: Take 500 mg by mouth 2 (two) times daily. ) 60 tablet 12  . pyridOXINE (VITAMIN B-6) 100 MG tablet Take 100 mg by mouth daily.      . vitamin B-12 (CYANOCOBALAMIN) 500 MCG tablet Take 500 mcg by mouth daily.     No current facility-administered medications for this visit.    Patient confirms/reports the following allergies:  No Known Allergies  No orders of the defined types were placed in this encounter.    AUTHORIZATION INFORMATION Primary Insurance:   ID #:  Group #:  Pre-Cert / Auth required: Pre-Cert / Auth #:   Secondary Insurance:   ID #:   Group #:  Pre-Cert / Auth required:  Pre-Cert / Auth #:   SCHEDULE INFORMATION: Procedure has been scheduled as follows:  Date:                    Time:   Location:   This Gastroenterology Pre-Precedure Review Form is being routed to the following provider(s): Jonette EvaSandi Fields, MD

## 2014-12-21 NOTE — Telephone Encounter (Signed)
SUPREP SPLIT DOSING, CLEAR LIQUIDS WITH BREAKFAST

## 2014-12-22 NOTE — Telephone Encounter (Signed)
LMOM to call and schedule.

## 2014-12-22 NOTE — Telephone Encounter (Signed)
See separate triage.  

## 2014-12-26 NOTE — Telephone Encounter (Signed)
PT's husband called to see if there was anyway that we could schedule the pt and have her stay at the hospital to be monitored until he could get off of work.  I told him that she would need someone that could go with her to the hospital and sign her out and take her home and stay with her the remainder of the day.  He said he will call back with the insurance info and get her scheduled.

## 2014-12-28 MED ORDER — NA SULFATE-K SULFATE-MG SULF 17.5-3.13-1.6 GM/177ML PO SOLN: 1.0000 | ORAL | Status: AC

## 2014-12-28 NOTE — Telephone Encounter (Signed)
Rx sent to the pharmacy.

## 2014-12-28 NOTE — Telephone Encounter (Signed)
Pt's husband called back with insurance info and scheduled colonoscopy for 01/09/2015 at 2:15 PM with Dr. Darrick PennaFields.

## 2015-01-02 ENCOUNTER — Other Ambulatory Visit: Payer: Self-pay

## 2015-01-02 DIAGNOSIS — Z1211 Encounter for screening for malignant neoplasm of colon: Secondary | ICD-10-CM

## 2015-01-02 NOTE — Telephone Encounter (Signed)
Instructions mailed to pt.

## 2015-01-05 ENCOUNTER — Telehealth: Payer: Self-pay

## 2015-01-05 NOTE — Telephone Encounter (Signed)
I called [employer] at [phone] and spoke to [place] L who said that a PA is not required for the screening colonoscopy.

## 2015-01-09 ENCOUNTER — Ambulatory Visit (HOSPITAL_COMMUNITY)
Admission: RE | Admit: 2015-01-09 | Discharge: 2015-01-09 | Disposition: A | Discharge status: Home / Self Care | Payer: Medicare Other | Source: Ambulatory Visit | Attending: Gastroenterology | Admitting: Gastroenterology

## 2015-01-09 ENCOUNTER — Encounter (HOSPITAL_COMMUNITY): Payer: Self-pay | Admitting: *Deleted

## 2015-01-09 ENCOUNTER — Encounter (HOSPITAL_COMMUNITY)
Admission: RE | Disposition: A | Discharge status: Home / Self Care | Payer: Self-pay | Source: Ambulatory Visit | Attending: Gastroenterology

## 2015-01-09 DIAGNOSIS — Z1211 Encounter for screening for malignant neoplasm of colon: Secondary | ICD-10-CM | POA: Diagnosis not present

## 2015-01-09 DIAGNOSIS — K633 Ulcer of intestine: Secondary | ICD-10-CM

## 2015-01-09 DIAGNOSIS — Z8673 Personal history of transient ischemic attack (TIA), and cerebral infarction without residual deficits: Secondary | ICD-10-CM | POA: Insufficient documentation

## 2015-01-09 DIAGNOSIS — Z90710 Acquired absence of both cervix and uterus: Secondary | ICD-10-CM | POA: Diagnosis not present

## 2015-01-09 DIAGNOSIS — Z79899 Other long term (current) drug therapy: Secondary | ICD-10-CM | POA: Diagnosis not present

## 2015-01-09 DIAGNOSIS — K649 Unspecified hemorrhoids: Secondary | ICD-10-CM | POA: Insufficient documentation

## 2015-01-09 DIAGNOSIS — N189 Chronic kidney disease, unspecified: Secondary | ICD-10-CM | POA: Insufficient documentation

## 2015-01-09 DIAGNOSIS — Z7982 Long term (current) use of aspirin: Secondary | ICD-10-CM | POA: Insufficient documentation

## 2015-01-09 DIAGNOSIS — E780 Pure hypercholesterolemia: Secondary | ICD-10-CM | POA: Insufficient documentation

## 2015-01-09 DIAGNOSIS — I129 Hypertensive chronic kidney disease with stage 1 through stage 4 chronic kidney disease, or unspecified chronic kidney disease: Secondary | ICD-10-CM | POA: Diagnosis not present

## 2015-01-09 DIAGNOSIS — Z87891 Personal history of nicotine dependence: Secondary | ICD-10-CM | POA: Diagnosis not present

## 2015-01-09 HISTORY — DX: Unspecified convulsions: R56.9

## 2015-01-09 HISTORY — PX: COLONOSCOPY: SHX5424

## 2015-01-09 SURGERY — COLONOSCOPY
Anesthesia: Moderate Sedation

## 2015-01-09 MED ORDER — MEPERIDINE HCL 100 MG/ML IJ SOLN
INTRAMUSCULAR | Status: DC | PRN
  Administered 2015-01-09: 25 mg via INTRAVENOUS
  Administered 2015-01-09: 25 mg
  Administered 2015-01-09 (×2): 25 mg via INTRAVENOUS

## 2015-01-09 MED ORDER — ATROPINE SULFATE 1 MG/ML IJ SOLN
INTRAMUSCULAR | Status: AC
  Filled 2015-01-09: qty 1

## 2015-01-09 MED ORDER — MIDAZOLAM HCL 5 MG/5ML IJ SOLN
INTRAMUSCULAR | Status: AC
  Filled 2015-01-09: qty 10

## 2015-01-09 MED ORDER — SODIUM CHLORIDE 0.9 % IV SOLN
INTRAVENOUS | Status: DC
  Administered 2015-01-09: 14:00:00 via INTRAVENOUS

## 2015-01-09 MED ORDER — STERILE WATER FOR IRRIGATION IR SOLN
Status: DC | PRN
  Administered 2015-01-09: 15:00:00

## 2015-01-09 MED ORDER — MIDAZOLAM HCL 5 MG/5ML IJ SOLN
INTRAMUSCULAR | Status: DC | PRN
  Administered 2015-01-09: 2 mg via INTRAVENOUS
  Administered 2015-01-09: 1 mg via INTRAVENOUS
  Administered 2015-01-09: 2 mg via INTRAVENOUS

## 2015-01-09 MED ORDER — ATROPINE SULFATE 1 MG/ML IJ SOLN
INTRAMUSCULAR | Status: DC | PRN
  Administered 2015-01-09: .5 mg via INTRAVENOUS

## 2015-01-09 MED ORDER — MEPERIDINE HCL 100 MG/ML IJ SOLN
INTRAMUSCULAR | Status: AC
  Filled 2015-01-09: qty 2

## 2015-01-09 NOTE — H&P (Signed)
Primary Care Physician:  Asencion Noble, MD Primary Gastroenterologist:  Dr. Oneida Alar  Pre-Procedure History & Physical: HPI:  [name] is a 54 y.o. female here for [place].   Past Medical History  Diagnosis Date  . CVA (cerebral infarction) 1998  . HTN (hypertension)   . Hypercholesteremia   . Hyponatremia     Na 126  . Chronic kidney disease   . Seizures     Last seizure in 2014    Past Surgical History  Procedure Laterality Date  . Abdominal hysterectomy      complete  . Uterine fibroid surgery      x 2    Prior to Admission medications   Medication Sig Start Date End Date Taking? Authorizing Provider  acetaminophen (TYLENOL) 500 MG tablet Take 500 mg by mouth every 6 (six) hours as needed for mild pain.    Yes Historical Provider, MD  amLODipine (NORVASC) 10 MG tablet Take 10 mg by mouth daily.  09/13/11  Yes Historical Provider, MD  aspirin 325 MG tablet Take 325 mg by mouth daily.   Yes Historical Provider, MD  carvedilol (COREG) 25 MG tablet Take 1 tablet by mouth 2 (two) times daily. 12/23/14  Yes Historical Provider, MD  clonazePAM (KLONOPIN) 0.5 MG tablet Take 0.5 mg by mouth 2 (two) times daily as needed for anxiety.   Yes Historical Provider, MD  cloNIDine (CATAPRES) 0.3 MG tablet Take 0.3 mg by mouth 2 (two) times daily.  09/02/11  Yes Historical Provider, MD  KLOR-CON M20 20 MEQ tablet Take 20 mEq by mouth daily.  09/02/11  Yes Historical Provider, MD  levETIRAcetam (KEPPRA) 500 MG tablet Take 500 mg by mouth 2 (two) times daily.   Yes Historical Provider, MD  levETIRAcetam (KEPPRA) 500 MG tablet Take 500 mg by mouth 2 (two) times daily.   Yes Historical Provider, MD  Na Sulfate-K Sulfate-Mg Sulf (SUPREP BOWEL PREP) SOLN Take 1 kit by mouth as directed. 12/28/14  Yes Danie Binder, MD  NON FORMULARY Vitamin D3  1000 IU   One tablet daily   Yes Historical Provider, MD  olmesartan-hydrochlorothiazide (BENICAR HCT) 40-25 MG per tablet Take 1 tablet by  mouth daily.   Yes Historical Provider, MD  simvastatin (ZOCOR) 10 MG tablet Take 10 mg by mouth at bedtime.  09/13/11  Yes Historical Provider, MD           Allergies as of 01/02/2015  . (No Known Allergies)    Family History  Problem Relation Age of Onset  . Coronary artery disease Father   . Coronary artery disease Brother   . Coronary artery disease Brother   . Stroke Mother     History   Social History  . Marital Status: Married    Spouse Name: N/A  . Number of Children: 0  . Years of Education: N/A   Occupational History  . disabled    Social History Main Topics  . Smoking status: Former Smoker -- 1.00 packs/day for 12 years    Types: Cigarettes    Quit date: 10/22/1991  . Smokeless tobacco: Not on file  . Alcohol Use: No  . Drug Use: No  . Sexual Activity: Not on file   Other Topics Concern  . Not on file   Social History Narrative    Review of Systems: See HPI, otherwise negative ROS   Physical Exam: BP 133/83 mmHg  Pulse 47  Temp(Src) 97.9 F (36.6 C) (Oral)  Resp 18  Ht 5' 7"  (1.702 m)  Wt 154 lb (69.854 kg)  BMI 24.11 kg/m2  SpO2 97% General:   Alert,  pleasant and cooperative in NAD Head:  Normocephalic and atraumatic. Neck:  Supple; Lungs:  Clear throughout to auscultation.    Heart:  Regular rate and rhythm. Abdomen:  Soft, nontender and nondistended. Normal bowel sounds, without guarding, and without rebound.   Neurologic:  Alert and  oriented x4;  grossly normal neurologically.  Impression/Plan:    SCREENING  Plan:  1. TCS TODAY

## 2015-01-09 NOTE — Discharge Instructions (Signed)
You have internal hemorrhoids. YOU DID NOT HAVE ANY POLYPS. YOU HAVE AN ULCER IN YOUR LEFT COLON MOST LIKELY DUE TO ASPIRIN USE. YOU HAVE AN EXTREMELY FLOPPY LEFT COLON. I BIOPSIED YOUR COLON ULCER.   Next colonoscopy in 10 years.  FOLLOW A HIGH FIBER DIET. AVOID ITEMS THAT CAUSE BLOATING. SEE INFO BELOW.  YOUR BIOPSY RESULTS WILL BE AVAILABLE IN MY CHART AFTER MAR 23  OR MY OFFICE WILL CONTACT YOU IN 10-14 DAYS WITH YOUR RESULTS.    Colonoscopy Care After Read the instructions outlined below and refer to this sheet in the next week. These discharge instructions provide you with general information on caring for yourself after you leave the hospital. While your treatment has been planned according to the most current medical practices available, unavoidable complications occasionally occur. If you have any problems or questions after discharge, call DR. Coreena Rubalcava, [phone].  ACTIVITY  You may resume your regular activity, but move at a slower pace for the next 24 hours.   Take frequent rest periods for the next 24 hours.   Walking will help get rid of the air and reduce the bloated feeling in your belly (abdomen).   No driving for 24 hours (because of the medicine (anesthesia) used during the test).   You may shower.   Do not sign any important legal documents or operate any machinery for 24 hours (because of the anesthesia used during the test).    NUTRITION  Drink plenty of fluids.   You may resume your normal diet as instructed by your doctor.   Begin with a light meal and progress to your normal diet. Heavy or fried foods are harder to digest and may make you feel sick to your stomach (nauseated).   Avoid alcoholic beverages for 24 hours or as instructed.    MEDICATIONS  You may resume your normal medications.   WHAT YOU CAN EXPECT TODAY  Some feelings of bloating in the abdomen.   Passage of more gas than usual.   Spotting of blood in your stool or on the  toilet paper  .  IF YOU HAD POLYPS REMOVED DURING THE COLONOSCOPY:  Eat a soft diet IF YOU HAVE NAUSEA, BLOATING, ABDOMINAL PAIN, OR VOMITING.    FINDING OUT THE RESULTS OF YOUR TEST Not all test results are available during your visit. DR. Darrick PennaFIELDS WILL CALL YOU WITHIN 14 DAYS OF YOUR PROCEDUE WITH YOUR RESULTS. Do not assume everything is normal if you have not heard from DR. Shaunika Italiano, CALL HER OFFICE AT [phone].  SEEK IMMEDIATE MEDICAL ATTENTION AND CALL THE OFFICE: [phone] IF:  You have more than a spotting of blood in your stool.   Your belly is swollen (abdominal distention).   You are nauseated or vomiting.   You have a temperature over 101F.   You have abdominal pain or discomfort that is severe or gets worse throughout the day.  High-Fiber Diet A high-fiber diet changes your normal diet to include more whole grains, legumes, fruits, and vegetables. Changes in the diet involve replacing refined carbohydrates with unrefined foods. The calorie level of the diet is essentially unchanged. The Dietary Reference Intake (recommended amount) for adult males is 38 grams per day. For adult females, it is 25 grams per day. Pregnant and lactating women should consume 28 grams of fiber per day. Fiber is the intact part of a plant that is not broken down during digestion. Functional fiber is fiber that has been isolated from the plant to provide  a beneficial effect in the body. PURPOSE  Increase stool bulk.   Ease and regulate bowel movements.   Lower cholesterol.  INDICATIONS THAT YOU NEED MORE FIBER  Constipation and hemorrhoids.   Uncomplicated diverticulosis (intestine condition) and irritable bowel syndrome.   Weight management.   As a protective measure against hardening of the arteries (atherosclerosis), diabetes, and cancer.   GUIDELINES FOR INCREASING FIBER IN THE DIET  Start adding fiber to the diet slowly. A gradual increase of about 5 more grams (2 slices of  whole-wheat bread, 2 servings of most fruits or vegetables, or 1 bowl of high-fiber cereal) per day is best. Too rapid an increase in fiber may result in constipation, flatulence, and bloating.   Drink enough water and fluids to keep your urine clear or pale yellow. Water, juice, or caffeine-free drinks are recommended. Not drinking enough fluid may cause constipation.   Eat a variety of high-fiber foods rather than one type of fiber.   Try to increase your intake of fiber through using high-fiber foods rather than fiber pills or supplements that contain small amounts of fiber.   The goal is to change the types of food eaten. Do not supplement your present diet with high-fiber foods, but replace foods in your present diet.  INCLUDE A VARIETY OF FIBER SOURCES  Replace refined and processed grains with whole grains, canned fruits with fresh fruits, and incorporate other fiber sources. White rice, white breads, and most bakery goods contain little or no fiber.   Brown whole-grain rice, buckwheat oats, and many fruits and vegetables are all good sources of fiber. These include: broccoli, Brussels sprouts, cabbage, cauliflower, beets, sweet potatoes, white potatoes (skin on), carrots, tomatoes, eggplant, squash, berries, fresh fruits, and dried fruits.   Cereals appear to be the richest source of fiber. Cereal fiber is found in whole grains and bran. Bran is the fiber-rich outer coat of cereal grain, which is largely removed in refining. In whole-grain cereals, the bran remains. In breakfast cereals, the largest amount of fiber is found in those with "bran" in their names. The fiber content is sometimes indicated on the label.   You may need to include additional fruits and vegetables each day.   In baking, for 1 cup white flour, you may use the following substitutions:   1 cup whole-wheat flour minus 2 tablespoons.   1/2 cup white flour plus 1/2 cup whole-wheat flour.   Hemorrhoids Hemorrhoids  are dilated (enlarged) veins around the rectum. Sometimes clots will form in the veins. This makes them swollen and painful. These are called thrombosed hemorrhoids. Causes of hemorrhoids include:  Constipation.   Straining to have a bowel movement.   HEAVY LIFTING HOME CARE INSTRUCTIONS  Eat a well balanced diet and drink 6 to 8 glasses of water every day to avoid constipation. You may also use a bulk laxative.   Avoid straining to have bowel movements.   Keep anal area dry and clean.   Do not use a donut shaped pillow or sit on the toilet for long periods. This increases blood pooling and pain.   Move your bowels when your body has the urge; this will require less straining and will decrease pain and pressure.

## 2015-01-09 NOTE — OR Nursing (Signed)
Pre procedure heartrate 43, atropine 0.5 ordered per Dr. Darrick PennaFields.  Pulse increased to 68.

## 2015-01-09 NOTE — Op Note (Signed)
[facility] [address] [place], [id number]   COLONOSCOPY PROCEDURE REPORT  PATIENT: [name], [name]  MR#: [id number] BIRTHDATE: [birth year] , 53  yrs. old GENDER: female ENDOSCOPIST: West BaliSandi L Graeden Bitner, MD REFERRED Raphael GibneyBY:Roy Fagan, M.D. PROCEDURE DATE:  01/09/2015 PROCEDURE:   Colonoscopy with biopsy INDICATIONS:average risk patient for colon cancer.  USES ASA DAILY. MEDICATIONS: Demerol 100 mg IV and Versed 5 mg IV  DESCRIPTION OF PROCEDURE:    Physical exam was performed.  Informed consent was obtained from the patient after explaining the benefits, risks, and alternatives to procedure.  The patient was connected to monitor and placed in left lateral position. Continuous oxygen was provided by nasal cannula and IV medicine administered through an indwelling cannula.  After administration of sedation and rectal exam, the patients rectum was intubated and the EC-[id number] ([id number]  colonoscope was advanced under direct visualization to the cecum.  The scope was removed slowly by carefully examining the color, texture, anatomy, and integrity mucosa on the way out.  The patient was recovered in endoscopy and discharged home in satisfactory condition.    COLON FINDINGS: The colon was redundant.  UNABLE TO ADVANCE ADULT COLONOSCOPE BEYOND THE SIGMOID COLON. SCOPE EXCHANGED FOR PEDS SCOPE. Manual abdominal counter-pressure was used to reach the cecum.  The patient was moved on to their back to reach the cecum, SHALLOW ULCERS IN DESCENDING COLON BIOPSIED VIA COLD FORCEPS, and Small internal hemorrhoids were found.  PREP QUALITY: good.  CECAL W/D TIME: 12       minutes COMPLICATIONS: None  ENDOSCOPIC IMPRESSION: 1.   The LEFT colon IS EXTREMELY redundant 2.   SHALLOW ULCERS IN DESCENDING COLON MOST LIKELY DUE TO ASA USE 3.   Small internal hemorrhoids  RECOMMENDATIONS: AWAIT BIOPSY HIGH FIBER DIET NEXT TCS IN 10 YEARS WITH AN OVERTUBE/PEDS  SCOPE.      _______________________________ Rosalie DoctoreSignedWest Bali:  Sha Amer L Burley Kopka, MD 01/09/2015 3:34 PM    CPT CODES: ICD CODES:  The ICD and CPT codes recommended by this software are interpretations from the data that the clinical staff has captured with the software.  The verification of the translation of this report to the ICD and CPT codes and modifiers is the sole responsibility of the health care institution and practicing physician where this report was generated.  [employer] will not be held responsible for the validity of the ICD and CPT codes included on this report.  AMA assumes no liability for data contained or not contained herein. CPT is a Publishing rights managerregistered trademark of the [employer].

## 2015-01-11 ENCOUNTER — Encounter (HOSPITAL_COMMUNITY): Payer: Self-pay | Admitting: Gastroenterology

## 2015-01-27 ENCOUNTER — Telehealth: Payer: Self-pay | Admitting: Gastroenterology

## 2015-01-27 NOTE — Telephone Encounter (Signed)
PLEASE CALL PT. HER COLON BIOPSY SHOWED A BENIGN COLON. SHE LIKELY HAS SOME HARDENING OF THE ARTERIES IN HER STOMACH DUE TO HER PRIOR HISTORY OF SMOKING AND WHEN SHE HAD THE BOWEL PREP SHE GOT A LITTLE DEHYDRATED.   Next colonoscopy in 10 years.  FOLLOW A HIGH FIBER DIET. AVOID ITEMS THAT CAUSE BLOATING.

## 2015-01-27 NOTE — Telephone Encounter (Signed)
Reminder in epic tcs in 10 yrs

## 2015-01-30 ENCOUNTER — Telehealth: Payer: Self-pay | Admitting: Gastroenterology

## 2015-01-30 NOTE — Telephone Encounter (Signed)
PATIENT RETURNED CALL, PLEASE CALL BACK AT [phone]

## 2015-01-30 NOTE — Telephone Encounter (Signed)
LMOM to call.

## 2015-01-31 NOTE — Telephone Encounter (Signed)
Pt is aware of results. 

## 2015-01-31 NOTE — Telephone Encounter (Signed)
See result note.  

## 2015-01-31 NOTE — Telephone Encounter (Signed)
LMOM to call.

## 2015-03-03 DIAGNOSIS — Z79899 Other long term (current) drug therapy: Secondary | ICD-10-CM | POA: Diagnosis not present

## 2015-03-03 DIAGNOSIS — N189 Chronic kidney disease, unspecified: Secondary | ICD-10-CM | POA: Diagnosis not present

## 2015-03-03 DIAGNOSIS — I10 Essential (primary) hypertension: Secondary | ICD-10-CM | POA: Diagnosis not present

## 2015-03-03 DIAGNOSIS — I6350 Cerebral infarction due to unspecified occlusion or stenosis of unspecified cerebral artery: Secondary | ICD-10-CM | POA: Diagnosis not present

## 2015-03-10 DIAGNOSIS — N189 Chronic kidney disease, unspecified: Secondary | ICD-10-CM | POA: Diagnosis not present

## 2015-03-10 DIAGNOSIS — I10 Essential (primary) hypertension: Secondary | ICD-10-CM | POA: Diagnosis not present

## 2015-03-10 DIAGNOSIS — R569 Unspecified convulsions: Secondary | ICD-10-CM | POA: Diagnosis not present

## 2015-05-04 DIAGNOSIS — Z6830 Body mass index (BMI) 30.0-30.9, adult: Secondary | ICD-10-CM | POA: Diagnosis not present

## 2015-05-04 DIAGNOSIS — L723 Sebaceous cyst: Secondary | ICD-10-CM | POA: Diagnosis not present

## 2015-06-05 DIAGNOSIS — H2513 Age-related nuclear cataract, bilateral: Secondary | ICD-10-CM | POA: Diagnosis not present

## 2015-06-09 DIAGNOSIS — E871 Hypo-osmolality and hyponatremia: Secondary | ICD-10-CM | POA: Diagnosis not present

## 2015-06-09 DIAGNOSIS — Z79899 Other long term (current) drug therapy: Secondary | ICD-10-CM | POA: Diagnosis not present

## 2015-06-09 DIAGNOSIS — N183 Chronic kidney disease, stage 3 (moderate): Secondary | ICD-10-CM | POA: Diagnosis not present

## 2015-06-09 DIAGNOSIS — I10 Essential (primary) hypertension: Secondary | ICD-10-CM | POA: Diagnosis not present

## 2015-06-16 DIAGNOSIS — N183 Chronic kidney disease, stage 3 (moderate): Secondary | ICD-10-CM | POA: Diagnosis not present

## 2015-06-16 DIAGNOSIS — Z23 Encounter for immunization: Secondary | ICD-10-CM | POA: Diagnosis not present

## 2015-06-16 DIAGNOSIS — I10 Essential (primary) hypertension: Secondary | ICD-10-CM | POA: Diagnosis not present

## 2015-06-16 DIAGNOSIS — E871 Hypo-osmolality and hyponatremia: Secondary | ICD-10-CM | POA: Diagnosis not present

## 2015-06-16 DIAGNOSIS — Z6830 Body mass index (BMI) 30.0-30.9, adult: Secondary | ICD-10-CM | POA: Diagnosis not present

## 2015-08-01 ENCOUNTER — Other Ambulatory Visit (HOSPITAL_COMMUNITY): Payer: Self-pay | Admitting: Internal Medicine

## 2015-08-01 DIAGNOSIS — Z1231 Encounter for screening mammogram for malignant neoplasm of breast: Secondary | ICD-10-CM

## 2015-08-11 DIAGNOSIS — Z23 Encounter for immunization: Secondary | ICD-10-CM | POA: Diagnosis not present

## 2015-09-08 ENCOUNTER — Ambulatory Visit (HOSPITAL_COMMUNITY)
Admission: RE | Admit: 2015-09-08 | Discharge: 2015-09-08 | Disposition: A | Discharge status: Home / Self Care | Payer: Medicare Other | Source: Ambulatory Visit | Attending: Internal Medicine | Admitting: Internal Medicine

## 2015-09-08 DIAGNOSIS — Z1231 Encounter for screening mammogram for malignant neoplasm of breast: Secondary | ICD-10-CM | POA: Diagnosis not present

## 2015-09-29 DIAGNOSIS — Z79899 Other long term (current) drug therapy: Secondary | ICD-10-CM | POA: Diagnosis not present

## 2015-09-29 DIAGNOSIS — I6350 Cerebral infarction due to unspecified occlusion or stenosis of unspecified cerebral artery: Secondary | ICD-10-CM | POA: Diagnosis not present

## 2015-10-05 DIAGNOSIS — I10 Essential (primary) hypertension: Secondary | ICD-10-CM | POA: Diagnosis not present

## 2015-10-05 DIAGNOSIS — R569 Unspecified convulsions: Secondary | ICD-10-CM | POA: Diagnosis not present

## 2015-10-05 DIAGNOSIS — Z6831 Body mass index (BMI) 31.0-31.9, adult: Secondary | ICD-10-CM | POA: Diagnosis not present

## 2015-10-05 DIAGNOSIS — N183 Chronic kidney disease, stage 3 (moderate): Secondary | ICD-10-CM | POA: Diagnosis not present

## 2016-01-05 DIAGNOSIS — I10 Essential (primary) hypertension: Secondary | ICD-10-CM | POA: Diagnosis not present

## 2016-01-05 DIAGNOSIS — G459 Transient cerebral ischemic attack, unspecified: Secondary | ICD-10-CM | POA: Diagnosis not present

## 2016-01-05 DIAGNOSIS — Z79899 Other long term (current) drug therapy: Secondary | ICD-10-CM | POA: Diagnosis not present

## 2016-01-12 DIAGNOSIS — R569 Unspecified convulsions: Secondary | ICD-10-CM | POA: Diagnosis not present

## 2016-01-12 DIAGNOSIS — I10 Essential (primary) hypertension: Secondary | ICD-10-CM | POA: Diagnosis not present

## 2016-01-12 DIAGNOSIS — N183 Chronic kidney disease, stage 3 (moderate): Secondary | ICD-10-CM | POA: Diagnosis not present

## 2016-04-12 DIAGNOSIS — N189 Chronic kidney disease, unspecified: Secondary | ICD-10-CM | POA: Diagnosis not present

## 2016-04-12 DIAGNOSIS — Z79899 Other long term (current) drug therapy: Secondary | ICD-10-CM | POA: Diagnosis not present

## 2016-04-12 DIAGNOSIS — E871 Hypo-osmolality and hyponatremia: Secondary | ICD-10-CM | POA: Diagnosis not present

## 2016-04-19 DIAGNOSIS — I10 Essential (primary) hypertension: Secondary | ICD-10-CM | POA: Diagnosis not present

## 2016-04-19 DIAGNOSIS — N183 Chronic kidney disease, stage 3 (moderate): Secondary | ICD-10-CM | POA: Diagnosis not present

## 2016-04-19 DIAGNOSIS — R569 Unspecified convulsions: Secondary | ICD-10-CM | POA: Diagnosis not present

## 2016-06-25 DIAGNOSIS — H40053 Ocular hypertension, bilateral: Secondary | ICD-10-CM | POA: Diagnosis not present

## 2016-06-25 DIAGNOSIS — H524 Presbyopia: Secondary | ICD-10-CM | POA: Diagnosis not present

## 2016-07-12 DIAGNOSIS — R569 Unspecified convulsions: Secondary | ICD-10-CM | POA: Diagnosis not present

## 2016-07-12 DIAGNOSIS — I6350 Cerebral infarction due to unspecified occlusion or stenosis of unspecified cerebral artery: Secondary | ICD-10-CM | POA: Diagnosis not present

## 2016-07-12 DIAGNOSIS — N189 Chronic kidney disease, unspecified: Secondary | ICD-10-CM | POA: Diagnosis not present

## 2016-07-12 DIAGNOSIS — Z79899 Other long term (current) drug therapy: Secondary | ICD-10-CM | POA: Diagnosis not present

## 2016-07-12 DIAGNOSIS — I10 Essential (primary) hypertension: Secondary | ICD-10-CM | POA: Diagnosis not present

## 2016-07-19 DIAGNOSIS — Z23 Encounter for immunization: Secondary | ICD-10-CM | POA: Diagnosis not present

## 2016-07-19 DIAGNOSIS — N183 Chronic kidney disease, stage 3 (moderate): Secondary | ICD-10-CM | POA: Diagnosis not present

## 2016-07-19 DIAGNOSIS — I10 Essential (primary) hypertension: Secondary | ICD-10-CM | POA: Diagnosis not present

## 2016-07-19 DIAGNOSIS — R569 Unspecified convulsions: Secondary | ICD-10-CM | POA: Diagnosis not present

## 2016-08-14 IMAGING — MG MM DIGITAL SCREENING
5 series · 5 of 5 positions shown · non-contrast
Comparison: Previous exam(s)

ACR Breast Density Category a: The breast tissue is almost entirely
fatty.

CLINICAL DATA: Screening.

EXAM:
DIGITAL SCREENING BILATERAL MAMMOGRAM WITH CAD

[L CC]
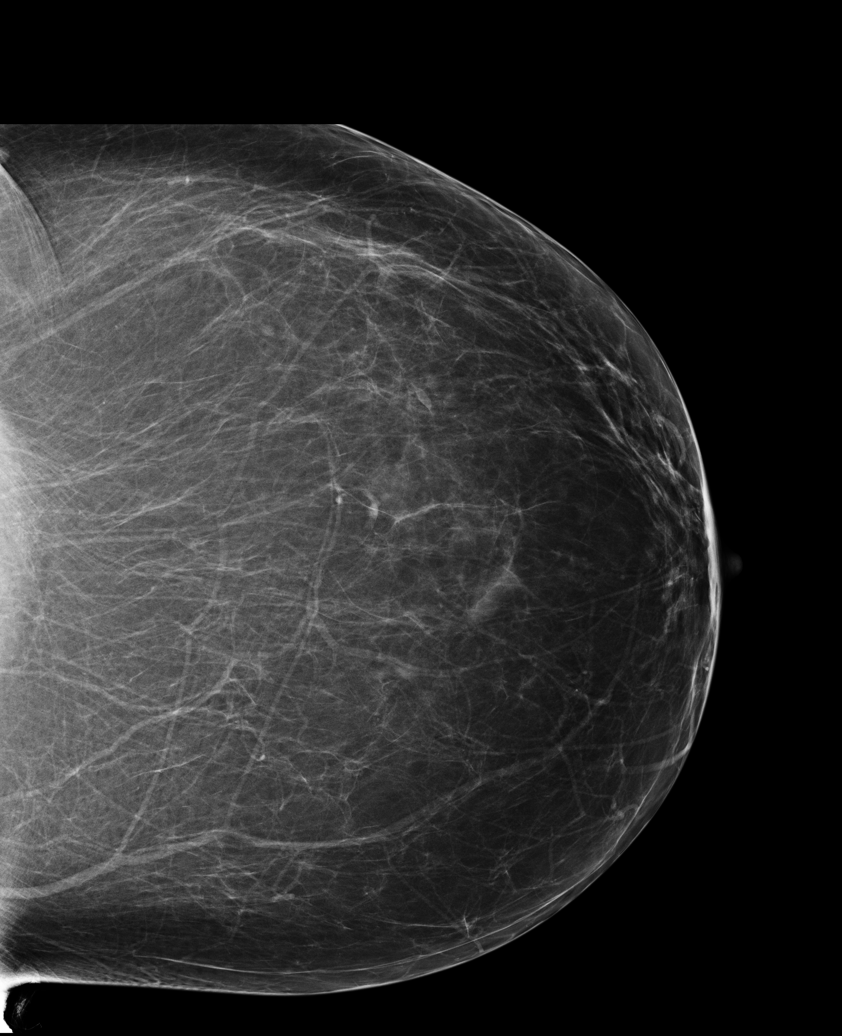

[L MLO]
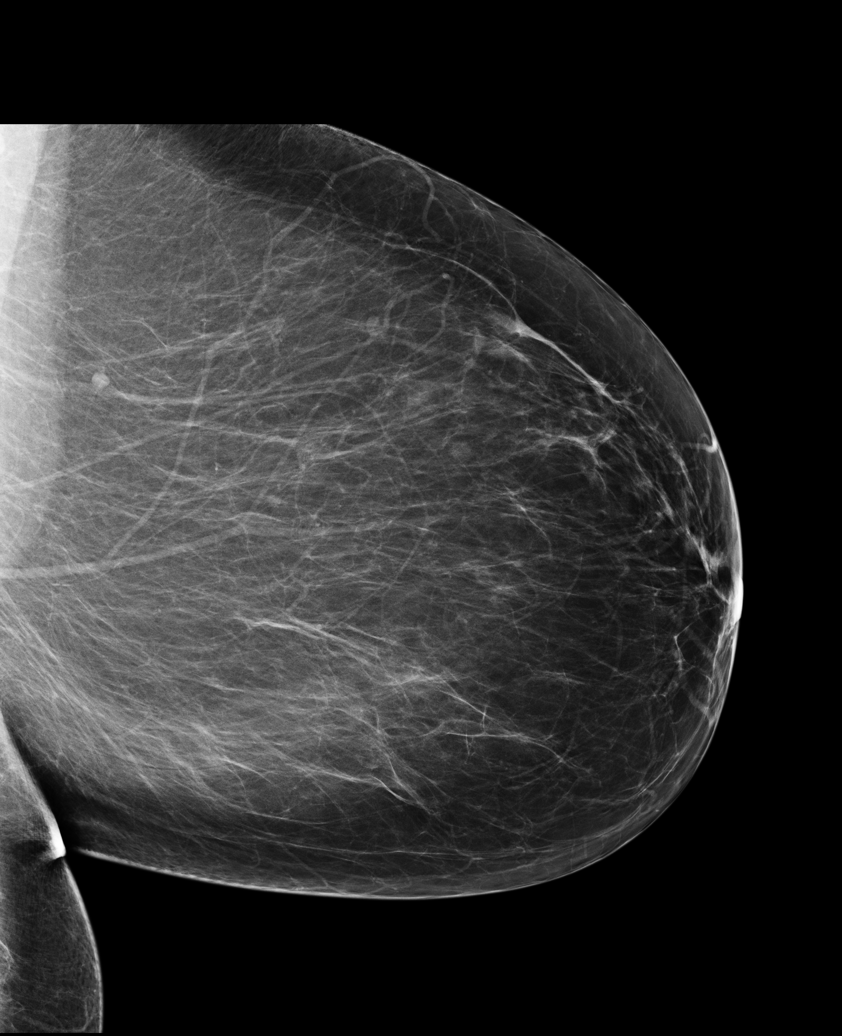

[R CC]
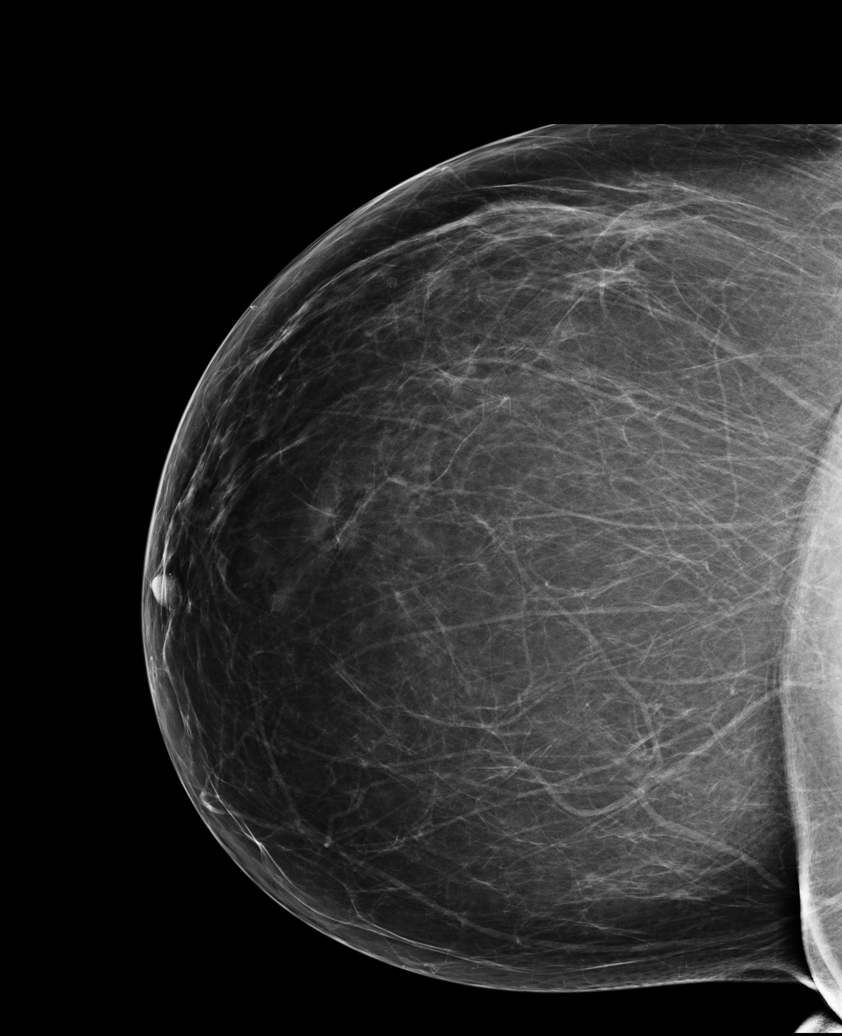

[R MLO (1 of 2)]
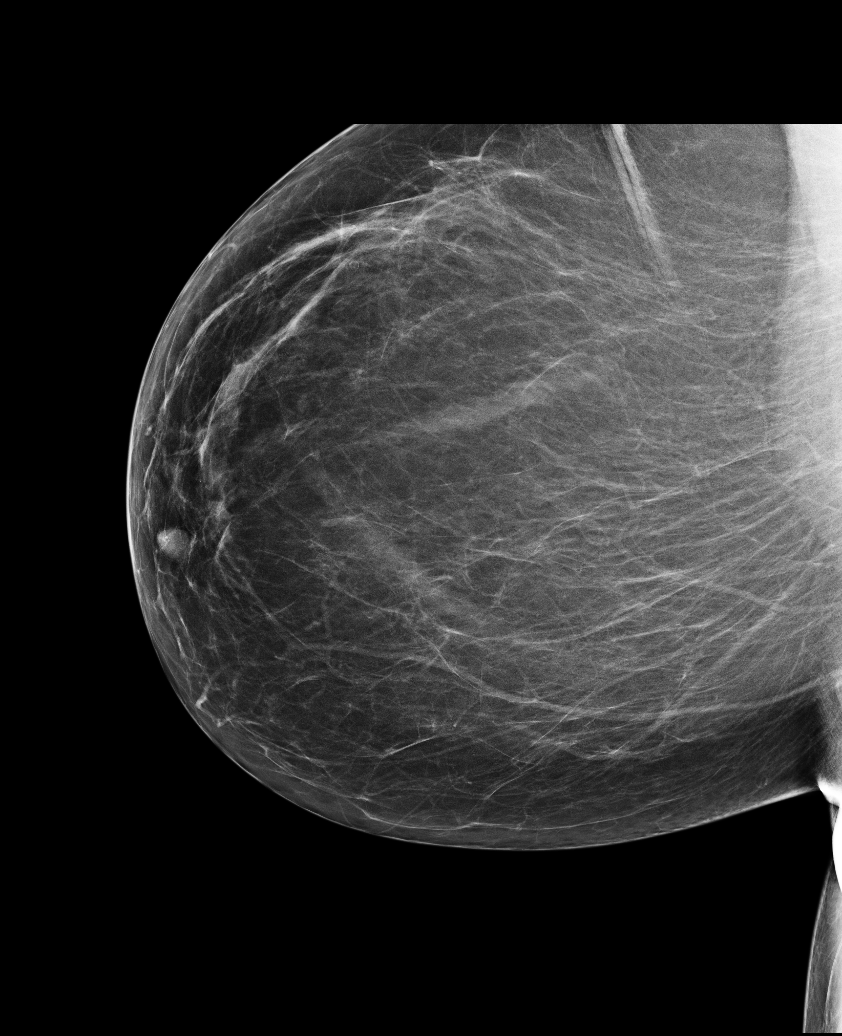

[R MLO (2 of 2)]
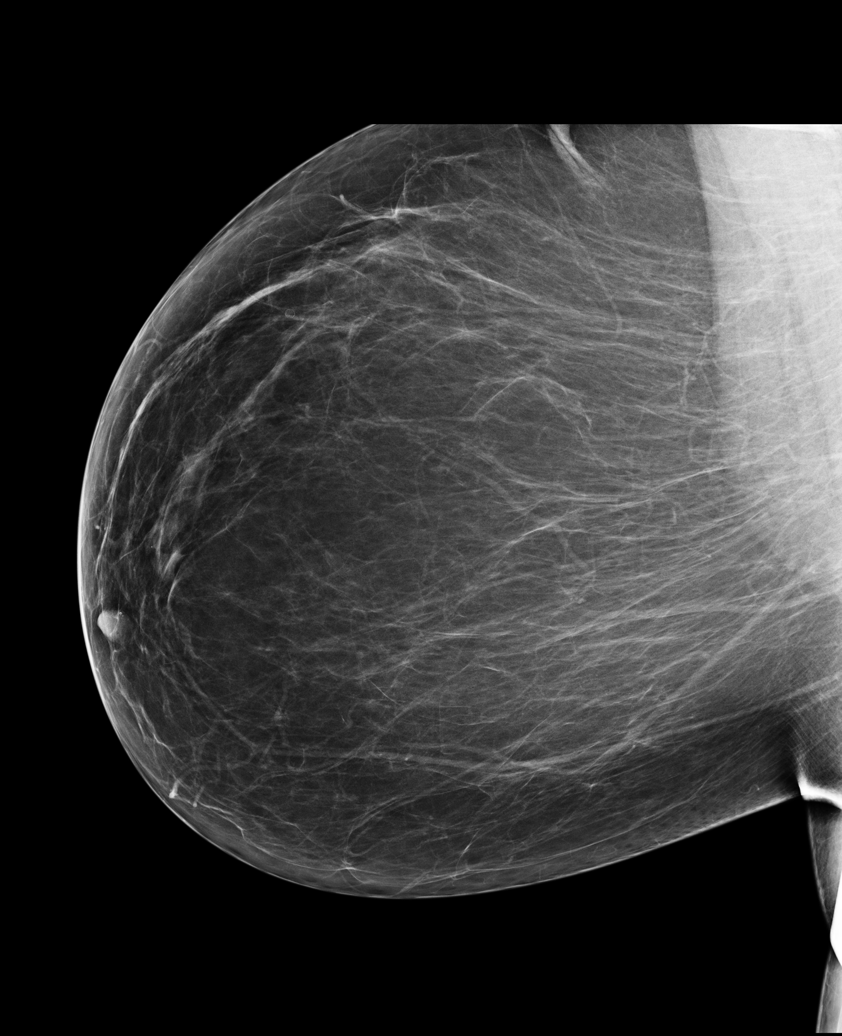

[5 of 5 positions shown; findings below may reference images not displayed]

FINDINGS: There are no findings suspicious for malignancy. Images were
processed with CAD.
IMPRESSION: No mammographic evidence of malignancy. A result letter of this
screening mammogram will be mailed directly to the patient.

RECOMMENDATION:
Screening mammogram in one year. (Code:JV-X-DYE)

BI-RADS CATEGORY  1: Negative.

## 2016-08-26 ENCOUNTER — Other Ambulatory Visit (HOSPITAL_COMMUNITY): Payer: Self-pay | Admitting: Internal Medicine

## 2016-08-26 DIAGNOSIS — Z1231 Encounter for screening mammogram for malignant neoplasm of breast: Secondary | ICD-10-CM

## 2016-09-09 ENCOUNTER — Other Ambulatory Visit (HOSPITAL_COMMUNITY): Payer: Self-pay | Admitting: Internal Medicine

## 2016-09-09 ENCOUNTER — Ambulatory Visit (HOSPITAL_COMMUNITY): Payer: Medicare Other

## 2016-09-09 ENCOUNTER — Ambulatory Visit (HOSPITAL_COMMUNITY)
Admission: RE | Admit: 2016-09-09 | Discharge: 2016-09-09 | Disposition: A | Discharge status: Home / Self Care | Payer: Medicare Other | Source: Ambulatory Visit | Attending: Internal Medicine | Admitting: Internal Medicine

## 2016-09-09 DIAGNOSIS — Z1231 Encounter for screening mammogram for malignant neoplasm of breast: Secondary | ICD-10-CM | POA: Insufficient documentation

## 2016-11-22 DIAGNOSIS — E119 Type 2 diabetes mellitus without complications: Secondary | ICD-10-CM | POA: Diagnosis not present

## 2016-11-22 DIAGNOSIS — Z79899 Other long term (current) drug therapy: Secondary | ICD-10-CM | POA: Diagnosis not present

## 2016-11-22 DIAGNOSIS — I10 Essential (primary) hypertension: Secondary | ICD-10-CM | POA: Diagnosis not present

## 2016-11-22 DIAGNOSIS — I6350 Cerebral infarction due to unspecified occlusion or stenosis of unspecified cerebral artery: Secondary | ICD-10-CM | POA: Diagnosis not present

## 2016-11-22 DIAGNOSIS — N189 Chronic kidney disease, unspecified: Secondary | ICD-10-CM | POA: Diagnosis not present

## 2016-11-29 DIAGNOSIS — R7301 Impaired fasting glucose: Secondary | ICD-10-CM | POA: Diagnosis not present

## 2016-11-29 DIAGNOSIS — N183 Chronic kidney disease, stage 3 (moderate): Secondary | ICD-10-CM | POA: Diagnosis not present

## 2016-11-29 DIAGNOSIS — R569 Unspecified convulsions: Secondary | ICD-10-CM | POA: Diagnosis not present

## 2017-04-04 DIAGNOSIS — I6350 Cerebral infarction due to unspecified occlusion or stenosis of unspecified cerebral artery: Secondary | ICD-10-CM | POA: Diagnosis not present

## 2017-04-04 DIAGNOSIS — I10 Essential (primary) hypertension: Secondary | ICD-10-CM | POA: Diagnosis not present

## 2017-04-04 DIAGNOSIS — Z79899 Other long term (current) drug therapy: Secondary | ICD-10-CM | POA: Diagnosis not present

## 2017-04-11 DIAGNOSIS — I10 Essential (primary) hypertension: Secondary | ICD-10-CM | POA: Diagnosis not present

## 2017-04-11 DIAGNOSIS — N183 Chronic kidney disease, stage 3 (moderate): Secondary | ICD-10-CM | POA: Diagnosis not present

## 2017-08-08 DIAGNOSIS — N183 Chronic kidney disease, stage 3 (moderate): Secondary | ICD-10-CM | POA: Diagnosis not present

## 2017-08-08 DIAGNOSIS — I6350 Cerebral infarction due to unspecified occlusion or stenosis of unspecified cerebral artery: Secondary | ICD-10-CM | POA: Diagnosis not present

## 2017-08-08 DIAGNOSIS — I10 Essential (primary) hypertension: Secondary | ICD-10-CM | POA: Diagnosis not present

## 2017-08-08 DIAGNOSIS — Z79899 Other long term (current) drug therapy: Secondary | ICD-10-CM | POA: Diagnosis not present

## 2017-08-15 DIAGNOSIS — I10 Essential (primary) hypertension: Secondary | ICD-10-CM | POA: Diagnosis not present

## 2017-08-15 DIAGNOSIS — Z23 Encounter for immunization: Secondary | ICD-10-CM | POA: Diagnosis not present

## 2017-08-15 DIAGNOSIS — N183 Chronic kidney disease, stage 3 (moderate): Secondary | ICD-10-CM | POA: Diagnosis not present

## 2017-08-15 DIAGNOSIS — E785 Hyperlipidemia, unspecified: Secondary | ICD-10-CM | POA: Diagnosis not present

## 2017-09-15 ENCOUNTER — Other Ambulatory Visit (HOSPITAL_COMMUNITY): Payer: Self-pay | Admitting: Internal Medicine

## 2017-09-15 DIAGNOSIS — Z1231 Encounter for screening mammogram for malignant neoplasm of breast: Secondary | ICD-10-CM

## 2017-09-17 ENCOUNTER — Ambulatory Visit (HOSPITAL_COMMUNITY)
Admission: RE | Admit: 2017-09-17 | Discharge: 2017-09-17 | Disposition: A | Discharge status: Home / Self Care | Payer: Medicare Other | Source: Ambulatory Visit | Attending: Internal Medicine | Admitting: Internal Medicine

## 2017-09-17 ENCOUNTER — Encounter (HOSPITAL_COMMUNITY): Payer: Self-pay

## 2017-09-17 DIAGNOSIS — Z1231 Encounter for screening mammogram for malignant neoplasm of breast: Secondary | ICD-10-CM | POA: Diagnosis not present

## 2017-09-19 ENCOUNTER — Other Ambulatory Visit (HOSPITAL_COMMUNITY): Payer: Self-pay | Admitting: Internal Medicine

## 2017-09-19 DIAGNOSIS — N6310 Unspecified lump in the right breast, unspecified quadrant: Secondary | ICD-10-CM

## 2017-10-07 ENCOUNTER — Ambulatory Visit (HOSPITAL_COMMUNITY)
Admission: RE | Admit: 2017-10-07 | Discharge: 2017-10-07 | Disposition: A | Discharge status: Home / Self Care | Payer: Medicare Other | Source: Ambulatory Visit | Attending: Internal Medicine | Admitting: Internal Medicine

## 2017-10-07 DIAGNOSIS — N6310 Unspecified lump in the right breast, unspecified quadrant: Secondary | ICD-10-CM | POA: Insufficient documentation

## 2017-10-07 DIAGNOSIS — N6311 Unspecified lump in the right breast, upper outer quadrant: Secondary | ICD-10-CM | POA: Diagnosis not present

## 2017-10-07 DIAGNOSIS — R928 Other abnormal and inconclusive findings on diagnostic imaging of breast: Secondary | ICD-10-CM | POA: Diagnosis not present

## 2017-11-13 DIAGNOSIS — R05 Cough: Secondary | ICD-10-CM | POA: Diagnosis not present

## 2017-11-21 DIAGNOSIS — Z79899 Other long term (current) drug therapy: Secondary | ICD-10-CM | POA: Diagnosis not present

## 2017-11-21 DIAGNOSIS — I10 Essential (primary) hypertension: Secondary | ICD-10-CM | POA: Diagnosis not present

## 2017-11-21 DIAGNOSIS — N183 Chronic kidney disease, stage 3 (moderate): Secondary | ICD-10-CM | POA: Diagnosis not present

## 2017-11-28 DIAGNOSIS — R569 Unspecified convulsions: Secondary | ICD-10-CM | POA: Diagnosis not present

## 2017-11-28 DIAGNOSIS — I10 Essential (primary) hypertension: Secondary | ICD-10-CM | POA: Diagnosis not present

## 2017-11-28 DIAGNOSIS — Z23 Encounter for immunization: Secondary | ICD-10-CM | POA: Diagnosis not present

## 2017-11-28 DIAGNOSIS — N183 Chronic kidney disease, stage 3 (moderate): Secondary | ICD-10-CM | POA: Diagnosis not present

## 2018-02-27 DIAGNOSIS — Z79899 Other long term (current) drug therapy: Secondary | ICD-10-CM | POA: Diagnosis not present

## 2018-02-27 DIAGNOSIS — N183 Chronic kidney disease, stage 3 (moderate): Secondary | ICD-10-CM | POA: Diagnosis not present

## 2018-02-27 DIAGNOSIS — I10 Essential (primary) hypertension: Secondary | ICD-10-CM | POA: Diagnosis not present

## 2018-03-06 DIAGNOSIS — I10 Essential (primary) hypertension: Secondary | ICD-10-CM | POA: Diagnosis not present

## 2018-03-06 DIAGNOSIS — N183 Chronic kidney disease, stage 3 (moderate): Secondary | ICD-10-CM | POA: Diagnosis not present

## 2018-03-06 DIAGNOSIS — R569 Unspecified convulsions: Secondary | ICD-10-CM | POA: Diagnosis not present

## 2018-06-01 DIAGNOSIS — I10 Essential (primary) hypertension: Secondary | ICD-10-CM | POA: Diagnosis not present

## 2018-06-05 DIAGNOSIS — N183 Chronic kidney disease, stage 3 (moderate): Secondary | ICD-10-CM | POA: Diagnosis not present

## 2018-06-05 DIAGNOSIS — E871 Hypo-osmolality and hyponatremia: Secondary | ICD-10-CM | POA: Diagnosis not present

## 2018-06-05 DIAGNOSIS — R7303 Prediabetes: Secondary | ICD-10-CM | POA: Diagnosis not present

## 2018-08-07 DIAGNOSIS — Z23 Encounter for immunization: Secondary | ICD-10-CM | POA: Diagnosis not present

## 2018-09-01 DIAGNOSIS — H35023 Exudative retinopathy, bilateral: Secondary | ICD-10-CM | POA: Diagnosis not present

## 2018-09-04 DIAGNOSIS — N183 Chronic kidney disease, stage 3 (moderate): Secondary | ICD-10-CM | POA: Diagnosis not present

## 2018-09-04 DIAGNOSIS — R7303 Prediabetes: Secondary | ICD-10-CM | POA: Diagnosis not present

## 2018-09-04 DIAGNOSIS — Z79899 Other long term (current) drug therapy: Secondary | ICD-10-CM | POA: Diagnosis not present

## 2018-09-11 DIAGNOSIS — E785 Hyperlipidemia, unspecified: Secondary | ICD-10-CM | POA: Diagnosis not present

## 2018-09-11 DIAGNOSIS — N183 Chronic kidney disease, stage 3 (moderate): Secondary | ICD-10-CM | POA: Diagnosis not present

## 2018-09-11 DIAGNOSIS — I10 Essential (primary) hypertension: Secondary | ICD-10-CM | POA: Diagnosis not present

## 2018-12-11 DIAGNOSIS — I10 Essential (primary) hypertension: Secondary | ICD-10-CM | POA: Diagnosis not present

## 2018-12-11 DIAGNOSIS — Z79899 Other long term (current) drug therapy: Secondary | ICD-10-CM | POA: Diagnosis not present

## 2018-12-11 DIAGNOSIS — N183 Chronic kidney disease, stage 3 (moderate): Secondary | ICD-10-CM | POA: Diagnosis not present

## 2018-12-11 DIAGNOSIS — R7303 Prediabetes: Secondary | ICD-10-CM | POA: Diagnosis not present

## 2018-12-18 DIAGNOSIS — R7303 Prediabetes: Secondary | ICD-10-CM | POA: Diagnosis not present

## 2018-12-18 DIAGNOSIS — N183 Chronic kidney disease, stage 3 (moderate): Secondary | ICD-10-CM | POA: Diagnosis not present

## 2018-12-18 DIAGNOSIS — I129 Hypertensive chronic kidney disease with stage 1 through stage 4 chronic kidney disease, or unspecified chronic kidney disease: Secondary | ICD-10-CM | POA: Diagnosis not present

## 2019-03-19 DIAGNOSIS — Z8673 Personal history of transient ischemic attack (TIA), and cerebral infarction without residual deficits: Secondary | ICD-10-CM | POA: Diagnosis not present

## 2019-06-25 DIAGNOSIS — R569 Unspecified convulsions: Secondary | ICD-10-CM | POA: Diagnosis not present

## 2019-06-25 DIAGNOSIS — Z8673 Personal history of transient ischemic attack (TIA), and cerebral infarction without residual deficits: Secondary | ICD-10-CM | POA: Diagnosis not present

## 2019-08-02 DIAGNOSIS — Z23 Encounter for immunization: Secondary | ICD-10-CM | POA: Diagnosis not present

## 2019-09-20 DIAGNOSIS — Z79899 Other long term (current) drug therapy: Secondary | ICD-10-CM | POA: Diagnosis not present

## 2019-09-20 DIAGNOSIS — R7303 Prediabetes: Secondary | ICD-10-CM | POA: Diagnosis not present

## 2019-09-20 DIAGNOSIS — N183 Chronic kidney disease, stage 3 unspecified: Secondary | ICD-10-CM | POA: Diagnosis not present

## 2019-09-20 DIAGNOSIS — I10 Essential (primary) hypertension: Secondary | ICD-10-CM | POA: Diagnosis not present

## 2019-09-24 DIAGNOSIS — N1831 Chronic kidney disease, stage 3a: Secondary | ICD-10-CM | POA: Diagnosis not present

## 2019-09-24 DIAGNOSIS — R7309 Other abnormal glucose: Secondary | ICD-10-CM | POA: Diagnosis not present

## 2019-09-24 DIAGNOSIS — E785 Hyperlipidemia, unspecified: Secondary | ICD-10-CM | POA: Diagnosis not present

## 2019-09-24 DIAGNOSIS — I10 Essential (primary) hypertension: Secondary | ICD-10-CM | POA: Diagnosis not present

## 2019-09-28 DIAGNOSIS — H524 Presbyopia: Secondary | ICD-10-CM | POA: Diagnosis not present

## 2019-09-28 DIAGNOSIS — H35033 Hypertensive retinopathy, bilateral: Secondary | ICD-10-CM | POA: Diagnosis not present

## 2020-01-25 DIAGNOSIS — R7303 Prediabetes: Secondary | ICD-10-CM | POA: Diagnosis not present

## 2020-01-25 DIAGNOSIS — E785 Hyperlipidemia, unspecified: Secondary | ICD-10-CM | POA: Diagnosis not present

## 2020-01-25 DIAGNOSIS — I10 Essential (primary) hypertension: Secondary | ICD-10-CM | POA: Diagnosis not present

## 2020-01-25 DIAGNOSIS — N183 Chronic kidney disease, stage 3 unspecified: Secondary | ICD-10-CM | POA: Diagnosis not present

## 2020-01-28 DIAGNOSIS — I129 Hypertensive chronic kidney disease with stage 1 through stage 4 chronic kidney disease, or unspecified chronic kidney disease: Secondary | ICD-10-CM | POA: Diagnosis not present

## 2020-01-28 DIAGNOSIS — N1831 Chronic kidney disease, stage 3a: Secondary | ICD-10-CM | POA: Diagnosis not present

## 2020-01-31 ENCOUNTER — Other Ambulatory Visit (HOSPITAL_COMMUNITY): Payer: Self-pay | Admitting: Internal Medicine

## 2020-01-31 DIAGNOSIS — N644 Mastodynia: Secondary | ICD-10-CM

## 2020-03-07 ENCOUNTER — Ambulatory Visit (HOSPITAL_COMMUNITY): Payer: Medicare Other

## 2020-03-07 ENCOUNTER — Encounter (HOSPITAL_COMMUNITY): Payer: Medicare Other

## 2020-03-07 ENCOUNTER — Encounter (HOSPITAL_COMMUNITY): Payer: Self-pay

## 2020-06-02 DIAGNOSIS — N183 Chronic kidney disease, stage 3 unspecified: Secondary | ICD-10-CM | POA: Diagnosis not present

## 2020-06-02 DIAGNOSIS — I10 Essential (primary) hypertension: Secondary | ICD-10-CM | POA: Diagnosis not present

## 2020-06-09 DIAGNOSIS — N1831 Chronic kidney disease, stage 3a: Secondary | ICD-10-CM | POA: Diagnosis not present

## 2020-06-09 DIAGNOSIS — I129 Hypertensive chronic kidney disease with stage 1 through stage 4 chronic kidney disease, or unspecified chronic kidney disease: Secondary | ICD-10-CM | POA: Diagnosis not present

## 2020-10-03 DIAGNOSIS — H524 Presbyopia: Secondary | ICD-10-CM | POA: Diagnosis not present

## 2020-10-03 DIAGNOSIS — Z961 Presence of intraocular lens: Secondary | ICD-10-CM | POA: Diagnosis not present

## 2020-10-06 DIAGNOSIS — I10 Essential (primary) hypertension: Secondary | ICD-10-CM | POA: Diagnosis not present

## 2020-10-06 DIAGNOSIS — I6300 Cerebral infarction due to thrombosis of unspecified precerebral artery: Secondary | ICD-10-CM | POA: Diagnosis not present

## 2020-10-06 DIAGNOSIS — R7303 Prediabetes: Secondary | ICD-10-CM | POA: Diagnosis not present

## 2020-10-06 DIAGNOSIS — E785 Hyperlipidemia, unspecified: Secondary | ICD-10-CM | POA: Diagnosis not present

## 2020-10-06 DIAGNOSIS — E871 Hypo-osmolality and hyponatremia: Secondary | ICD-10-CM | POA: Diagnosis not present

## 2020-10-06 DIAGNOSIS — M1990 Unspecified osteoarthritis, unspecified site: Secondary | ICD-10-CM | POA: Diagnosis not present

## 2020-10-06 DIAGNOSIS — N183 Chronic kidney disease, stage 3 unspecified: Secondary | ICD-10-CM | POA: Diagnosis not present

## 2020-10-06 DIAGNOSIS — Z79899 Other long term (current) drug therapy: Secondary | ICD-10-CM | POA: Diagnosis not present

## 2020-10-06 DIAGNOSIS — R569 Unspecified convulsions: Secondary | ICD-10-CM | POA: Diagnosis not present

## 2020-10-20 DIAGNOSIS — I10 Essential (primary) hypertension: Secondary | ICD-10-CM | POA: Diagnosis not present

## 2020-10-20 DIAGNOSIS — N1831 Chronic kidney disease, stage 3a: Secondary | ICD-10-CM | POA: Diagnosis not present

## 2020-10-20 DIAGNOSIS — Z23 Encounter for immunization: Secondary | ICD-10-CM | POA: Diagnosis not present

## 2020-10-20 DIAGNOSIS — R7309 Other abnormal glucose: Secondary | ICD-10-CM | POA: Diagnosis not present

## 2020-11-14 ENCOUNTER — Other Ambulatory Visit (HOSPITAL_COMMUNITY): Payer: Medicare Other

## 2020-11-14 ENCOUNTER — Ambulatory Visit (HOSPITAL_COMMUNITY)
Admission: RE | Admit: 2020-11-14 | Discharge: 2020-11-14 | Disposition: A | Discharge status: Home / Self Care | Payer: Medicare Other | Source: Ambulatory Visit | Attending: Internal Medicine | Admitting: Internal Medicine

## 2020-11-14 ENCOUNTER — Other Ambulatory Visit: Payer: Self-pay

## 2020-11-14 DIAGNOSIS — N644 Mastodynia: Secondary | ICD-10-CM

## 2020-11-14 DIAGNOSIS — R922 Inconclusive mammogram: Secondary | ICD-10-CM | POA: Diagnosis not present

## 2020-12-18 DIAGNOSIS — N1832 Chronic kidney disease, stage 3b: Secondary | ICD-10-CM | POA: Diagnosis not present

## 2020-12-18 DIAGNOSIS — M1990 Unspecified osteoarthritis, unspecified site: Secondary | ICD-10-CM | POA: Diagnosis not present

## 2020-12-18 DIAGNOSIS — I6350 Cerebral infarction due to unspecified occlusion or stenosis of unspecified cerebral artery: Secondary | ICD-10-CM | POA: Diagnosis not present

## 2020-12-18 DIAGNOSIS — I10 Essential (primary) hypertension: Secondary | ICD-10-CM | POA: Diagnosis not present

## 2021-01-12 DIAGNOSIS — N183 Chronic kidney disease, stage 3 unspecified: Secondary | ICD-10-CM | POA: Diagnosis not present

## 2021-01-12 DIAGNOSIS — I10 Essential (primary) hypertension: Secondary | ICD-10-CM | POA: Diagnosis not present

## 2021-01-12 DIAGNOSIS — R7303 Prediabetes: Secondary | ICD-10-CM | POA: Diagnosis not present

## 2021-01-12 DIAGNOSIS — E785 Hyperlipidemia, unspecified: Secondary | ICD-10-CM | POA: Diagnosis not present

## 2021-01-19 DIAGNOSIS — R7309 Other abnormal glucose: Secondary | ICD-10-CM | POA: Diagnosis not present

## 2021-01-19 DIAGNOSIS — E871 Hypo-osmolality and hyponatremia: Secondary | ICD-10-CM | POA: Diagnosis not present

## 2021-01-19 DIAGNOSIS — I10 Essential (primary) hypertension: Secondary | ICD-10-CM | POA: Diagnosis not present

## 2021-01-19 DIAGNOSIS — N1831 Chronic kidney disease, stage 3a: Secondary | ICD-10-CM | POA: Diagnosis not present

## 2021-03-20 DIAGNOSIS — I10 Essential (primary) hypertension: Secondary | ICD-10-CM | POA: Diagnosis not present

## 2021-03-20 DIAGNOSIS — N189 Chronic kidney disease, unspecified: Secondary | ICD-10-CM | POA: Diagnosis not present

## 2021-03-20 DIAGNOSIS — M1990 Unspecified osteoarthritis, unspecified site: Secondary | ICD-10-CM | POA: Diagnosis not present

## 2021-03-20 DIAGNOSIS — I6350 Cerebral infarction due to unspecified occlusion or stenosis of unspecified cerebral artery: Secondary | ICD-10-CM | POA: Diagnosis not present

## 2021-04-20 DIAGNOSIS — Z79899 Other long term (current) drug therapy: Secondary | ICD-10-CM | POA: Diagnosis not present

## 2021-04-20 DIAGNOSIS — R7303 Prediabetes: Secondary | ICD-10-CM | POA: Diagnosis not present

## 2021-04-20 DIAGNOSIS — N1831 Chronic kidney disease, stage 3a: Secondary | ICD-10-CM | POA: Diagnosis not present

## 2021-04-20 DIAGNOSIS — G464 Cerebellar stroke syndrome: Secondary | ICD-10-CM | POA: Diagnosis not present

## 2021-04-27 DIAGNOSIS — N1832 Chronic kidney disease, stage 3b: Secondary | ICD-10-CM | POA: Diagnosis not present

## 2021-04-27 DIAGNOSIS — I10 Essential (primary) hypertension: Secondary | ICD-10-CM | POA: Diagnosis not present

## 2021-06-20 DIAGNOSIS — M1990 Unspecified osteoarthritis, unspecified site: Secondary | ICD-10-CM | POA: Diagnosis not present

## 2021-06-20 DIAGNOSIS — I10 Essential (primary) hypertension: Secondary | ICD-10-CM | POA: Diagnosis not present

## 2021-06-20 DIAGNOSIS — N189 Chronic kidney disease, unspecified: Secondary | ICD-10-CM | POA: Diagnosis not present

## 2021-06-20 DIAGNOSIS — I6350 Cerebral infarction due to unspecified occlusion or stenosis of unspecified cerebral artery: Secondary | ICD-10-CM | POA: Diagnosis not present

## 2021-07-05 DIAGNOSIS — Z0000 Encounter for general adult medical examination without abnormal findings: Secondary | ICD-10-CM | POA: Diagnosis not present

## 2021-07-26 DIAGNOSIS — Z79899 Other long term (current) drug therapy: Secondary | ICD-10-CM | POA: Diagnosis not present

## 2021-07-26 DIAGNOSIS — I10 Essential (primary) hypertension: Secondary | ICD-10-CM | POA: Diagnosis not present

## 2021-07-26 DIAGNOSIS — I6300 Cerebral infarction due to thrombosis of unspecified precerebral artery: Secondary | ICD-10-CM | POA: Diagnosis not present

## 2021-07-26 DIAGNOSIS — N1832 Chronic kidney disease, stage 3b: Secondary | ICD-10-CM | POA: Diagnosis not present

## 2021-08-03 DIAGNOSIS — E871 Hypo-osmolality and hyponatremia: Secondary | ICD-10-CM | POA: Diagnosis not present

## 2021-08-03 DIAGNOSIS — Z23 Encounter for immunization: Secondary | ICD-10-CM | POA: Diagnosis not present

## 2021-08-03 DIAGNOSIS — I10 Essential (primary) hypertension: Secondary | ICD-10-CM | POA: Diagnosis not present

## 2021-08-03 DIAGNOSIS — N183 Chronic kidney disease, stage 3 unspecified: Secondary | ICD-10-CM | POA: Diagnosis not present

## 2021-10-25 DIAGNOSIS — H524 Presbyopia: Secondary | ICD-10-CM | POA: Diagnosis not present

## 2021-10-25 DIAGNOSIS — H2513 Age-related nuclear cataract, bilateral: Secondary | ICD-10-CM | POA: Diagnosis not present

## 2021-10-30 ENCOUNTER — Other Ambulatory Visit (HOSPITAL_COMMUNITY): Payer: Self-pay | Admitting: Internal Medicine

## 2021-10-30 DIAGNOSIS — Z1231 Encounter for screening mammogram for malignant neoplasm of breast: Secondary | ICD-10-CM

## 2021-11-02 DIAGNOSIS — I10 Essential (primary) hypertension: Secondary | ICD-10-CM | POA: Diagnosis not present

## 2021-11-02 DIAGNOSIS — N1832 Chronic kidney disease, stage 3b: Secondary | ICD-10-CM | POA: Diagnosis not present

## 2021-11-02 DIAGNOSIS — E871 Hypo-osmolality and hyponatremia: Secondary | ICD-10-CM | POA: Diagnosis not present

## 2021-11-02 DIAGNOSIS — Z79899 Other long term (current) drug therapy: Secondary | ICD-10-CM | POA: Diagnosis not present

## 2021-11-09 DIAGNOSIS — I350 Nonrheumatic aortic (valve) stenosis: Secondary | ICD-10-CM | POA: Diagnosis not present

## 2021-11-09 DIAGNOSIS — I10 Essential (primary) hypertension: Secondary | ICD-10-CM | POA: Diagnosis not present

## 2021-11-09 DIAGNOSIS — N1831 Chronic kidney disease, stage 3a: Secondary | ICD-10-CM | POA: Diagnosis not present

## 2021-11-12 ENCOUNTER — Other Ambulatory Visit (HOSPITAL_COMMUNITY): Payer: Self-pay | Admitting: Internal Medicine

## 2021-11-12 DIAGNOSIS — I350 Nonrheumatic aortic (valve) stenosis: Secondary | ICD-10-CM

## 2021-11-16 ENCOUNTER — Ambulatory Visit (HOSPITAL_COMMUNITY)
Admission: RE | Admit: 2021-11-16 | Discharge: 2021-11-16 | Disposition: A | Discharge status: Home / Self Care | Payer: Medicare Other | Source: Ambulatory Visit | Attending: Internal Medicine | Admitting: Internal Medicine

## 2021-11-16 ENCOUNTER — Other Ambulatory Visit: Payer: Self-pay

## 2021-11-16 DIAGNOSIS — Z1231 Encounter for screening mammogram for malignant neoplasm of breast: Secondary | ICD-10-CM | POA: Insufficient documentation

## 2021-12-11 ENCOUNTER — Ambulatory Visit (HOSPITAL_COMMUNITY)
Admission: RE | Admit: 2021-12-11 | Discharge: 2021-12-11 | Disposition: A | Discharge status: Home / Self Care | Payer: Medicare Other | Source: Ambulatory Visit | Attending: Internal Medicine | Admitting: Internal Medicine

## 2021-12-11 DIAGNOSIS — I350 Nonrheumatic aortic (valve) stenosis: Secondary | ICD-10-CM | POA: Diagnosis not present

## 2021-12-11 DIAGNOSIS — I351 Nonrheumatic aortic (valve) insufficiency: Secondary | ICD-10-CM

## 2021-12-11 LAB — ECHOCARDIOGRAM COMPLETE
AR max vel: 1.84 cm2
AV Area VTI: 1.94 cm2
AV Area mean vel: 1.93 cm2
AV Mean grad: 14 mmHg
AV Peak grad: 25.4 mmHg
Ao pk vel: 2.52 m/s
Area-P 1/2: 1.74 cm2
P 1/2 time: 809 msec
S' Lateral: 3.4 cm

## 2021-12-11 NOTE — Progress Notes (Signed)
*  PRELIMINARY RESULTS* Echocardiogram 2D Echocardiogram has been performed.  Stacey Drain 12/11/2021, 11:19 AM

## 2022-02-01 DIAGNOSIS — I10 Essential (primary) hypertension: Secondary | ICD-10-CM | POA: Diagnosis not present

## 2022-02-01 DIAGNOSIS — I639 Cerebral infarction, unspecified: Secondary | ICD-10-CM | POA: Diagnosis not present

## 2022-02-01 DIAGNOSIS — N1832 Chronic kidney disease, stage 3b: Secondary | ICD-10-CM | POA: Diagnosis not present

## 2022-02-01 DIAGNOSIS — Z79899 Other long term (current) drug therapy: Secondary | ICD-10-CM | POA: Diagnosis not present

## 2022-02-08 DIAGNOSIS — R7309 Other abnormal glucose: Secondary | ICD-10-CM | POA: Diagnosis not present

## 2022-02-08 DIAGNOSIS — E785 Hyperlipidemia, unspecified: Secondary | ICD-10-CM | POA: Diagnosis not present

## 2022-02-08 DIAGNOSIS — I350 Nonrheumatic aortic (valve) stenosis: Secondary | ICD-10-CM | POA: Diagnosis not present

## 2022-02-08 DIAGNOSIS — N1831 Chronic kidney disease, stage 3a: Secondary | ICD-10-CM | POA: Diagnosis not present

## 2022-02-08 DIAGNOSIS — I10 Essential (primary) hypertension: Secondary | ICD-10-CM | POA: Diagnosis not present

## 2022-05-01 DIAGNOSIS — E785 Hyperlipidemia, unspecified: Secondary | ICD-10-CM | POA: Diagnosis not present

## 2022-05-01 DIAGNOSIS — I10 Essential (primary) hypertension: Secondary | ICD-10-CM | POA: Diagnosis not present

## 2022-05-01 DIAGNOSIS — Z79899 Other long term (current) drug therapy: Secondary | ICD-10-CM | POA: Diagnosis not present

## 2022-05-01 DIAGNOSIS — N1832 Chronic kidney disease, stage 3b: Secondary | ICD-10-CM | POA: Diagnosis not present

## 2022-05-08 DIAGNOSIS — N1832 Chronic kidney disease, stage 3b: Secondary | ICD-10-CM | POA: Diagnosis not present

## 2022-05-08 DIAGNOSIS — E871 Hypo-osmolality and hyponatremia: Secondary | ICD-10-CM | POA: Diagnosis not present

## 2022-05-08 DIAGNOSIS — I10 Essential (primary) hypertension: Secondary | ICD-10-CM | POA: Diagnosis not present

## 2022-08-13 DIAGNOSIS — I10 Essential (primary) hypertension: Secondary | ICD-10-CM | POA: Diagnosis not present

## 2022-08-13 DIAGNOSIS — Z79899 Other long term (current) drug therapy: Secondary | ICD-10-CM | POA: Diagnosis not present

## 2022-08-13 DIAGNOSIS — N1831 Chronic kidney disease, stage 3a: Secondary | ICD-10-CM | POA: Diagnosis not present

## 2022-08-13 DIAGNOSIS — E871 Hypo-osmolality and hyponatremia: Secondary | ICD-10-CM | POA: Diagnosis not present

## 2022-08-20 DIAGNOSIS — E871 Hypo-osmolality and hyponatremia: Secondary | ICD-10-CM | POA: Diagnosis not present

## 2022-08-20 DIAGNOSIS — Z23 Encounter for immunization: Secondary | ICD-10-CM | POA: Diagnosis not present

## 2022-08-20 DIAGNOSIS — I10 Essential (primary) hypertension: Secondary | ICD-10-CM | POA: Diagnosis not present

## 2022-08-20 DIAGNOSIS — N1831 Chronic kidney disease, stage 3a: Secondary | ICD-10-CM | POA: Diagnosis not present

## 2022-08-20 DIAGNOSIS — R7309 Other abnormal glucose: Secondary | ICD-10-CM | POA: Diagnosis not present

## 2022-11-12 DIAGNOSIS — H524 Presbyopia: Secondary | ICD-10-CM | POA: Diagnosis not present

## 2022-11-12 DIAGNOSIS — H2513 Age-related nuclear cataract, bilateral: Secondary | ICD-10-CM | POA: Diagnosis not present

## 2022-11-15 DIAGNOSIS — N1831 Chronic kidney disease, stage 3a: Secondary | ICD-10-CM | POA: Diagnosis not present

## 2022-11-15 DIAGNOSIS — E871 Hypo-osmolality and hyponatremia: Secondary | ICD-10-CM | POA: Diagnosis not present

## 2022-11-15 DIAGNOSIS — I10 Essential (primary) hypertension: Secondary | ICD-10-CM | POA: Diagnosis not present

## 2022-11-15 DIAGNOSIS — Z79899 Other long term (current) drug therapy: Secondary | ICD-10-CM | POA: Diagnosis not present

## 2022-11-22 DIAGNOSIS — N1831 Chronic kidney disease, stage 3a: Secondary | ICD-10-CM | POA: Diagnosis not present

## 2022-11-22 DIAGNOSIS — I10 Essential (primary) hypertension: Secondary | ICD-10-CM | POA: Diagnosis not present

## 2022-11-22 DIAGNOSIS — R7309 Other abnormal glucose: Secondary | ICD-10-CM | POA: Diagnosis not present

## 2022-11-22 DIAGNOSIS — E871 Hypo-osmolality and hyponatremia: Secondary | ICD-10-CM | POA: Diagnosis not present

## 2022-11-28 ENCOUNTER — Other Ambulatory Visit (HOSPITAL_COMMUNITY): Payer: Self-pay | Admitting: Internal Medicine

## 2022-11-28 DIAGNOSIS — Z1231 Encounter for screening mammogram for malignant neoplasm of breast: Secondary | ICD-10-CM

## 2022-12-04 ENCOUNTER — Ambulatory Visit (HOSPITAL_COMMUNITY)
Admission: RE | Admit: 2022-12-04 | Discharge: 2022-12-04 | Disposition: A | Discharge status: Home / Self Care | Payer: Medicare Other | Source: Ambulatory Visit | Attending: Internal Medicine | Admitting: Internal Medicine

## 2022-12-04 DIAGNOSIS — Z1231 Encounter for screening mammogram for malignant neoplasm of breast: Secondary | ICD-10-CM | POA: Insufficient documentation

## 2023-02-14 DIAGNOSIS — I6300 Cerebral infarction due to thrombosis of unspecified precerebral artery: Secondary | ICD-10-CM | POA: Diagnosis not present

## 2023-02-14 DIAGNOSIS — Z79899 Other long term (current) drug therapy: Secondary | ICD-10-CM | POA: Diagnosis not present

## 2023-02-14 DIAGNOSIS — N1831 Chronic kidney disease, stage 3a: Secondary | ICD-10-CM | POA: Diagnosis not present

## 2023-02-14 DIAGNOSIS — I10 Essential (primary) hypertension: Secondary | ICD-10-CM | POA: Diagnosis not present

## 2023-02-21 DIAGNOSIS — N1831 Chronic kidney disease, stage 3a: Secondary | ICD-10-CM | POA: Diagnosis not present

## 2023-02-21 DIAGNOSIS — E871 Hypo-osmolality and hyponatremia: Secondary | ICD-10-CM | POA: Diagnosis not present

## 2023-02-21 DIAGNOSIS — I10 Essential (primary) hypertension: Secondary | ICD-10-CM | POA: Diagnosis not present

## 2023-05-19 DIAGNOSIS — E871 Hypo-osmolality and hyponatremia: Secondary | ICD-10-CM | POA: Diagnosis not present

## 2023-05-19 DIAGNOSIS — Z79899 Other long term (current) drug therapy: Secondary | ICD-10-CM | POA: Diagnosis not present

## 2023-05-19 DIAGNOSIS — I10 Essential (primary) hypertension: Secondary | ICD-10-CM | POA: Diagnosis not present

## 2023-05-19 DIAGNOSIS — N1831 Chronic kidney disease, stage 3a: Secondary | ICD-10-CM | POA: Diagnosis not present

## 2023-05-23 DIAGNOSIS — I10 Essential (primary) hypertension: Secondary | ICD-10-CM | POA: Diagnosis not present

## 2023-05-23 DIAGNOSIS — E871 Hypo-osmolality and hyponatremia: Secondary | ICD-10-CM | POA: Diagnosis not present

## 2023-05-23 DIAGNOSIS — N1831 Chronic kidney disease, stage 3a: Secondary | ICD-10-CM | POA: Diagnosis not present

## 2023-09-03 DIAGNOSIS — N1832 Chronic kidney disease, stage 3b: Secondary | ICD-10-CM | POA: Diagnosis not present

## 2023-09-03 DIAGNOSIS — Z79899 Other long term (current) drug therapy: Secondary | ICD-10-CM | POA: Diagnosis not present

## 2023-09-03 DIAGNOSIS — E871 Hypo-osmolality and hyponatremia: Secondary | ICD-10-CM | POA: Diagnosis not present

## 2023-09-10 DIAGNOSIS — I10 Essential (primary) hypertension: Secondary | ICD-10-CM | POA: Diagnosis not present

## 2023-09-10 DIAGNOSIS — Z23 Encounter for immunization: Secondary | ICD-10-CM | POA: Diagnosis not present

## 2023-09-10 DIAGNOSIS — N183 Chronic kidney disease, stage 3 unspecified: Secondary | ICD-10-CM | POA: Diagnosis not present

## 2023-11-17 ENCOUNTER — Other Ambulatory Visit (HOSPITAL_COMMUNITY): Payer: Self-pay | Admitting: Internal Medicine

## 2023-11-17 DIAGNOSIS — Z1231 Encounter for screening mammogram for malignant neoplasm of breast: Secondary | ICD-10-CM

## 2023-12-08 ENCOUNTER — Ambulatory Visit (HOSPITAL_COMMUNITY)
Admission: RE | Admit: 2023-12-08 | Discharge: 2023-12-08 | Disposition: A | Discharge status: Home / Self Care | Payer: Medicare Other | Source: Ambulatory Visit | Attending: Internal Medicine | Admitting: Internal Medicine

## 2023-12-08 DIAGNOSIS — Z1231 Encounter for screening mammogram for malignant neoplasm of breast: Secondary | ICD-10-CM | POA: Insufficient documentation

## 2023-12-08 DIAGNOSIS — I10 Essential (primary) hypertension: Secondary | ICD-10-CM | POA: Diagnosis not present

## 2023-12-08 DIAGNOSIS — E871 Hypo-osmolality and hyponatremia: Secondary | ICD-10-CM | POA: Diagnosis not present

## 2023-12-08 DIAGNOSIS — I6300 Cerebral infarction due to thrombosis of unspecified precerebral artery: Secondary | ICD-10-CM | POA: Diagnosis not present

## 2023-12-08 DIAGNOSIS — N1832 Chronic kidney disease, stage 3b: Secondary | ICD-10-CM | POA: Diagnosis not present

## 2023-12-10 ENCOUNTER — Ambulatory Visit (HOSPITAL_COMMUNITY): Payer: Medicare Other

## 2023-12-12 ENCOUNTER — Ambulatory Visit (HOSPITAL_COMMUNITY): Payer: Medicare Other

## 2023-12-16 DIAGNOSIS — E785 Hyperlipidemia, unspecified: Secondary | ICD-10-CM | POA: Diagnosis not present

## 2023-12-16 DIAGNOSIS — I10 Essential (primary) hypertension: Secondary | ICD-10-CM | POA: Diagnosis not present

## 2023-12-16 DIAGNOSIS — N183 Chronic kidney disease, stage 3 unspecified: Secondary | ICD-10-CM | POA: Diagnosis not present

## 2023-12-17 DIAGNOSIS — N183 Chronic kidney disease, stage 3 unspecified: Secondary | ICD-10-CM | POA: Diagnosis not present

## 2023-12-17 DIAGNOSIS — I10 Essential (primary) hypertension: Secondary | ICD-10-CM | POA: Diagnosis not present

## 2023-12-17 DIAGNOSIS — E785 Hyperlipidemia, unspecified: Secondary | ICD-10-CM | POA: Diagnosis not present

## 2024-03-17 DIAGNOSIS — I700 Atherosclerosis of aorta: Secondary | ICD-10-CM | POA: Diagnosis not present

## 2024-03-17 DIAGNOSIS — Z79899 Other long term (current) drug therapy: Secondary | ICD-10-CM | POA: Diagnosis not present

## 2024-03-17 DIAGNOSIS — I10 Essential (primary) hypertension: Secondary | ICD-10-CM | POA: Diagnosis not present

## 2024-03-17 DIAGNOSIS — N1832 Chronic kidney disease, stage 3b: Secondary | ICD-10-CM | POA: Diagnosis not present

## 2024-03-24 DIAGNOSIS — I10 Essential (primary) hypertension: Secondary | ICD-10-CM | POA: Diagnosis not present

## 2024-03-24 DIAGNOSIS — N183 Chronic kidney disease, stage 3 unspecified: Secondary | ICD-10-CM | POA: Diagnosis not present

## 2024-03-26 DIAGNOSIS — H2513 Age-related nuclear cataract, bilateral: Secondary | ICD-10-CM | POA: Diagnosis not present

## 2024-03-26 DIAGNOSIS — H524 Presbyopia: Secondary | ICD-10-CM | POA: Diagnosis not present

## 2024-07-21 DIAGNOSIS — E871 Hypo-osmolality and hyponatremia: Secondary | ICD-10-CM | POA: Diagnosis not present

## 2024-07-21 DIAGNOSIS — N1831 Chronic kidney disease, stage 3a: Secondary | ICD-10-CM | POA: Diagnosis not present

## 2024-07-21 DIAGNOSIS — I10 Essential (primary) hypertension: Secondary | ICD-10-CM | POA: Diagnosis not present

## 2024-07-21 DIAGNOSIS — Z79899 Other long term (current) drug therapy: Secondary | ICD-10-CM | POA: Diagnosis not present

## 2024-07-28 DIAGNOSIS — R7303 Prediabetes: Secondary | ICD-10-CM | POA: Diagnosis not present

## 2024-07-28 DIAGNOSIS — N183 Chronic kidney disease, stage 3 unspecified: Secondary | ICD-10-CM | POA: Diagnosis not present

## 2024-07-28 DIAGNOSIS — E871 Hypo-osmolality and hyponatremia: Secondary | ICD-10-CM | POA: Diagnosis not present

## 2024-07-28 DIAGNOSIS — I10 Essential (primary) hypertension: Secondary | ICD-10-CM | POA: Diagnosis not present

## 2024-07-28 DIAGNOSIS — Z23 Encounter for immunization: Secondary | ICD-10-CM | POA: Diagnosis not present

## 2024-11-23 ENCOUNTER — Other Ambulatory Visit (HOSPITAL_COMMUNITY): Payer: Self-pay | Admitting: Internal Medicine

## 2024-11-23 DIAGNOSIS — Z1231 Encounter for screening mammogram for malignant neoplasm of breast: Secondary | ICD-10-CM

## 2024-11-26 ENCOUNTER — Encounter (INDEPENDENT_AMBULATORY_CARE_PROVIDER_SITE_OTHER): Payer: Self-pay | Admitting: *Deleted

## 2024-12-08 ENCOUNTER — Ambulatory Visit (HOSPITAL_COMMUNITY)
